# Patient Record
Sex: Male | Born: 1975 | Race: Black or African American | Hispanic: No | Marital: Married | Smoking: Never smoker
Health system: Southern US, Community
[De-identification: ages and names within clinical notes are randomized; demographics above are authoritative.]

## PROBLEM LIST (undated history)

## (undated) DIAGNOSIS — D696 Thrombocytopenia, unspecified: Secondary | ICD-10-CM

## (undated) DIAGNOSIS — D6851 Activated protein C resistance: Secondary | ICD-10-CM

## (undated) HISTORY — DX: Thrombocytopenia, unspecified: D69.6

## (undated) HISTORY — PX: NO PAST SURGERIES: SHX2092

## (undated) HISTORY — DX: Activated protein C resistance: D68.51

---

## 2003-02-26 ENCOUNTER — Encounter: Admission: RE | Admit: 2003-02-26 | Discharge: 2003-02-26 | Payer: Self-pay | Admitting: Family Medicine

## 2017-06-16 ENCOUNTER — Ambulatory Visit: Payer: BLUE CROSS/BLUE SHIELD | Admitting: Family Medicine

## 2017-06-16 ENCOUNTER — Encounter: Payer: Self-pay | Admitting: Family Medicine

## 2017-06-16 VITALS — BP 112/72 | HR 52 | Temp 98.1°F | Ht 69.0 in | Wt 174.0 lb

## 2017-06-16 DIAGNOSIS — D696 Thrombocytopenia, unspecified: Secondary | ICD-10-CM | POA: Insufficient documentation

## 2017-06-16 NOTE — Patient Instructions (Signed)
Let us know if you need anything.  

## 2017-06-16 NOTE — Progress Notes (Signed)
Pre visit review using our clinic review tool, if applicable. No additional management support is needed unless otherwise documented below in the visit note. 

## 2017-06-16 NOTE — Progress Notes (Signed)
Chief Complaint  Patient presents with  . Establish Care       New Patient Visit SUBJECTIVE: HPI: Curtis Lawson is an 42 y.o.male who is being seen for establishing care.  The patient was previously seen at [facility].   Pt has a hx of thrombocytopenia. Does not remember numbers, asymptomatic.  Curious about any screening that he needs. PSA questions. Had some chest spasms earlier in week, [facility] and had largely unremarkable EKG and Echo. No issues since then.   No Known Allergies  Past Medical History:  Diagnosis Date  . Thrombocytopenia (HCC)    History reviewed. No pertinent surgical history. Social History   Socioeconomic History  . Marital status: Married   Family History  Problem Relation Age of Onset  . Diabetes Mother    Takes no meds routinely.   ROS Cardiovascular: Denies chest pain  Respiratory: Denies dyspnea   OBJECTIVE: BP 112/72 (BP Location: Left Arm, Patient Position: Sitting, Cuff Size: Normal)   Pulse (!) 52   Temp 98.1 F (36.7 C) (Oral)   Ht 5\' 9"  (1.753 m)   Wt 174 lb (78.9 kg)   SpO2 98%   BMI 25.70 kg/m   Constitutional: -  VS reviewed -  Well developed, well nourished, appears stated age -  No apparent distress  Psychiatric: -  Oriented to person, place, and time -  Memory intact -  Affect and mood normal -  Fluent conversation, good eye contact -  Judgment and insight age appropriate  Eye: -  Conjunctivae clear, no discharge -  Pupils symmetric, round, reactive to light  ENMT: -  MMM    Pharynx moist, no exudate, no erythema  Neck: -  No gross swelling, no palpable masses -  Thyroid midline, not enlarged, mobile, no palpable masses  Cardiovascular: -  Reg rhythm, bradycardic -  No LE edema  Respiratory: -  Normal respiratory effort, no accessory muscle use, no retraction -  Breath sounds equal, no wheezes, no ronchi, no crackles  Skin: -  No significant lesion on inspection -  Warm and dry to  palpation   ASSESSMENT/PLAN: Thrombocytopenia (HCC)  CP has resolved.  Counseled on risks and benefits of prostate cancer screening, he opted to forego any screening for now. Will get us records.  Patient should return for CPE at earliest convenience. The patient voiced understanding and agreement to the plan.   Curtis Rocheicholas Paul Meiners OaksWendling, DO 06/16/17  11:59 AM

## 2017-11-29 ENCOUNTER — Encounter: Payer: Self-pay | Admitting: Family Medicine

## 2017-11-29 ENCOUNTER — Ambulatory Visit (INDEPENDENT_AMBULATORY_CARE_PROVIDER_SITE_OTHER): Payer: BLUE CROSS/BLUE SHIELD

## 2017-11-29 ENCOUNTER — Ambulatory Visit: Payer: BLUE CROSS/BLUE SHIELD | Admitting: Family Medicine

## 2017-11-29 VITALS — BP 128/76 | HR 76 | Ht 69.0 in | Wt 175.0 lb

## 2017-11-29 DIAGNOSIS — M25512 Pain in left shoulder: Secondary | ICD-10-CM

## 2017-11-29 MED ORDER — DICLOFENAC SODIUM 2 % TD SOLN
1.0000 | Freq: Two times a day (BID) | TRANSDERMAL | 3 refills | Status: DC
Start: 2017-11-29 — End: 2018-06-04

## 2017-11-29 MED ORDER — NAPROXEN-ESOMEPRAZOLE 500-20 MG PO TBEC: 1.0000 | DELAYED_RELEASE_TABLET | Freq: Two times a day (BID) | ORAL | 3 refills | Status: DC

## 2017-11-29 NOTE — Progress Notes (Signed)
Curtis Lawson - 42 y.o. male MRN [id number]  Date of birth: [birth year]  SUBJECTIVE:  Including CC & ROS.  Chief Complaint  Patient presents with  . Left Shoulder pain    Curtis Lawson is a 42 y.o. male that is presenting with left shoulder pain. Pain has been chronic in nature, increasing over the past tow months. He does crossfit three to four days week. Pain is located in the posterior aspect of his left shoulder. Mild to severe during abduction and raising his arm back. Denies tingling and numbness. He has not taken anything for the pain. Denies injury or trauma.     Review of Systems  Constitutional: Negative for fever.  HENT: Negative for congestion.   Respiratory: Negative for cough.   Cardiovascular: Negative for chest pain.  Gastrointestinal: Negative for abdominal pain.  Musculoskeletal: Negative for back pain.  Skin: Negative for color change.  Neurological: Negative for weakness.  Hematological: Negative for adenopathy.  Psychiatric/Behavioral: Negative for agitation.    HISTORY: Past Medical, Surgical, Social, and Family History Reviewed & Updated per EMR.   Pertinent Historical Findings include:  Past Medical History:  Diagnosis Date  . Thrombocytopenia (HCC)     No past surgical history on file.  No Known Allergies  Family History  Problem Relation Age of Onset  . Diabetes Mother      Social History   Socioeconomic History  . Marital status: Married    Spouse name: Not on file  . Number of children: Not on file  . Years of education: Not on file  . Highest education level: Not on file  Occupational History  . Not on file  Social Needs  . Financial resource strain: Not on file  . Food insecurity:    Worry: Not on file    Inability: Not on file  . Transportation needs:    Medical: Not on file    Non-medical: Not on file  Tobacco Use  . Smoking status: Never Smoker  . Smokeless tobacco: Never Used  Substance and Sexual Activity  .  Alcohol use: Yes    Comment: not a on regular basis--couple times per month  . Drug use: Never  . Sexual activity: Not on file  Lifestyle  . Physical activity:    Days per week: Not on file    Minutes per session: Not on file  . Stress: Not on file  Relationships  . Social connections:    Talks on phone: Not on file    Gets together: Not on file    Attends religious service: Not on file    Active member of club or organization: Not on file    Attends meetings of clubs or organizations: Not on file    Relationship status: Not on file  . Intimate partner violence:    Fear of current or ex partner: Not on file    Emotionally abused: Not on file    Physically abused: Not on file    Forced sexual activity: Not on file  Other Topics Concern  . Not on file  Social History Narrative  . Not on file     PHYSICAL EXAM:  VS: BP 128/76   Pulse 76   Ht 5\' 9"  (1.753 m)   Wt 175 lb (79.4 kg)   SpO2 98%   BMI 25.84 kg/m  Physical Exam Gen: NAD, alert, cooperative with exam, well-appearing ENT: normal lips, normal nasal mucosa,  Eye: normal EOM, normal conjunctiva and lids CV:  no  edema, +2 pedal pulses   Resp: no accessory muscle use, non-labored,   Skin: no rashes, no areas of induration  Neuro: normal tone, normal sensation to touch Psych:  normal insight, alert and oriented MSK:  Left Shoulder: Inspection reveals no abnormalities, atrophy or asymmetry. Palpation is normal with no tenderness over AC joint . ROM is full in all planes. Rotator cuff strength normal throughout. Normal Hawkin's tests, empty can sign. Speeds  tests normal. negative Obrien's Neurovascularly intact   Limited ultrasound: left shoulder :  Normal-appearing biceps tendon. Normal-appearing subscap.  There is a small calcification at the insertion. Normal-appearing supraspinatus.  Dynamic testing revealed impingement when abduction was 90 degrees or more.  There appears to be a bursa that is slightly  enlarged that impinges when this abduction occurs. Normal-appearing infraspinatus. Normal-appearing AC joint.  Summary: Appears to have a small bursa with impinging when dynamically testing greater than 90 degrees.  Ultrasound and interpretation by Clare Gandy, MD       ASSESSMENT & PLAN:   Left shoulder pain Evaluating on ultrasound suggest that in dynamic testing he has an impingement that is occurring.  Does not appear to have any structural changes to the rotator cuff -Try a course of Vimovo -Pennsaid provided. -Counseled on home exercise therapy. -If no improvement can consider physical therapy, imaging or injection therapy.

## 2017-11-29 NOTE — Patient Instructions (Signed)
Nice to meet you  Please take the medicine for 10 days straight and then as needed Please try the exercises  Please see me back in 3-4 weeks if your symptoms are not improving.

## 2017-11-29 NOTE — Assessment & Plan Note (Signed)
Evaluating on ultrasound suggest that in dynamic testing he has an impingement that is occurring.  Does not appear to have any structural changes to the rotator cuff -Try a course of Vimovo -Pennsaid provided. -Counseled on home exercise therapy. -If no improvement can consider physical therapy, imaging or injection therapy.

## 2018-02-09 ENCOUNTER — Encounter: Payer: Self-pay | Admitting: Family Medicine

## 2018-02-09 ENCOUNTER — Ambulatory Visit (INDEPENDENT_AMBULATORY_CARE_PROVIDER_SITE_OTHER): Payer: BLUE CROSS/BLUE SHIELD | Admitting: Family Medicine

## 2018-02-09 VITALS — BP 118/84 | HR 54 | Temp 98.2°F | Ht 69.0 in | Wt 177.0 lb

## 2018-02-09 DIAGNOSIS — L720 Epidermal cyst: Secondary | ICD-10-CM | POA: Insufficient documentation

## 2018-02-09 NOTE — Patient Instructions (Signed)
Epidermal Cyst An epidermal cyst is sometimes called an epidermal inclusion cyst or an infundibular cyst. It is a sac made of skin tissue. The sac contains a substance called keratin. Keratin is a protein that is normally secreted through the hair follicles. When keratin becomes trapped in the top layer of skin (epidermis), it can form an epidermal cyst. Epidermal cysts are usually found on the face, neck, trunk, and genitals. These cysts are usually harmless (benign), and they may not cause symptoms unless they become infected. It is important not to pop epidermal cysts yourself. What are the causes? This condition may be caused by:  A blocked hair follicle.  A hair that curls and re-enters the skin instead of growing straight out of the skin (ingrown hair).  A blocked pore.  Irritated skin.  An injury to the skin.  Certain conditions that are passed along from parent to child (inherited).  Human papillomavirus (HPV).  What increases the risk? The following factors may make you more likely to develop an epidermal cyst:  Having acne.  Being overweight.  Wearing tight clothing.  What are the signs or symptoms? The only symptom of this condition may be a small, painless lump underneath the skin. When an epidermal cyst becomes infected, symptoms may include:  Redness.  Inflammation.  Tenderness.  Warmth.  Fever.  Keratin draining from the cyst. Keratin may look like a grayish-white, bad-smelling substance.  Pus draining from the cyst.  How is this diagnosed? This condition is diagnosed with a physical exam. In some cases, you may have a sample of tissue (biopsy) taken from your cyst to be examined under a microscope or tested for bacteria. You may be referred to a health care provider who specializes in skin care (dermatologist). How is this treated? In many cases, epidermal cysts go away on their own without treatment. If a cyst becomes infected, treatment may  include:  Opening and draining the cyst. After draining, minor surgery to remove the rest of the cyst may be done.  Antibiotic medicine to help prevent infection.  Injections of medicines (steroids) that help to reduce inflammation.  Surgery to remove the cyst. Surgery may be done if: ? The cyst becomes large. ? The cyst bothers you. ? There is a chance that the cyst could turn into cancer.  Follow these instructions at home:  Take over-the-counter and prescription medicines only as told by your health care provider.  If you were prescribed an antibiotic, use it as told by your health care provider. Do not stop using the antibiotic even if you start to feel better.  Keep the area around your cyst clean and dry.  Wear loose, dry clothing.  Do not try to pop your cyst.  Avoid touching your cyst.  Check your cyst every day for signs of infection.  Keep all follow-up visits as told by your health care provider. This is important. How is this prevented?  Wear clean, dry, clothing.  Avoid wearing tight clothing.  Keep your skin clean and dry. Shower or take baths every day.  Wash your body with a benzoyl peroxide wash when you shower or bathe. Contact a health care provider if:  Your cyst develops symptoms of infection.  Your condition is not improving or is getting worse.  You develop a cyst that looks different from other cysts you have had.  You have a fever. Get help right away if:  Redness spreads from the cyst into the surrounding area. This information is   not intended to replace advice given to you by your health care provider. Make sure you discuss any questions you have with your health care provider. Document Released: 02/06/2004 Document Revised: 11/04/2015 Document Reviewed: 01/07/2015 Elsevier Interactive Patient Education  2018 Elsevier Inc.  

## 2018-02-09 NOTE — Progress Notes (Signed)
Pre visit review using our clinic review tool, if applicable. No additional management support is needed unless otherwise documented below in the visit note. 

## 2018-02-09 NOTE — Progress Notes (Signed)
Chief Complaint  Patient presents with  . Mass    left side of neck    Curtis Lawson is a 42 y.o. male here for a skin complaint.  Duration: 2 months Location: L side of neck Pruritic? No Painful? No Drainage? No New soaps/lotions/topicals/detergents? No Sick contacts? No Other associated symptoms: None Therapies tried thus far: none  ROS:  Const: No fevers Skin: As noted in HPI  Past Medical History:  Diagnosis Date  . Thrombocytopenia (HCC)     BP 118/84 (BP Location: Right Arm, Patient Position: Sitting, Cuff Size: Normal)   Pulse (!) 54   Temp 98.2 F (36.8 C) (Oral)   Ht 5\' 9"  (1.753 m)   Wt 177 lb (80.3 kg)   SpO2 98%   BMI 26.14 kg/m  Gen: awake, alert, appearing stated age Lungs: No accessory muscle use Skin: L side of neck, spherical mass with black pinpoint area just off center. Measures approx 0.3 cm in diameter. No drainage, erythema, TTP, fluctuance, excoriation Psych: Age appropriate judgment and insight  Epidermal cyst  Info given. Will remove if his wife wants him to.  F/u prn. The patient voiced understanding and agreement to the plan.  Jilda Rocheicholas Paul [place], DO 02/09/18 11:21 AM

## 2018-03-20 ENCOUNTER — Emergency Department (HOSPITAL_BASED_OUTPATIENT_CLINIC_OR_DEPARTMENT_OTHER)
Admission: EM | Admit: 2018-03-20 | Discharge: 2018-03-20 | Disposition: A | Discharge status: Home / Self Care | Payer: BLUE CROSS/BLUE SHIELD | Attending: Emergency Medicine | Admitting: Emergency Medicine

## 2018-03-20 ENCOUNTER — Encounter (HOSPITAL_BASED_OUTPATIENT_CLINIC_OR_DEPARTMENT_OTHER): Payer: Self-pay | Admitting: Emergency Medicine

## 2018-03-20 ENCOUNTER — Other Ambulatory Visit: Payer: Self-pay

## 2018-03-20 DIAGNOSIS — Z79899 Other long term (current) drug therapy: Secondary | ICD-10-CM | POA: Insufficient documentation

## 2018-03-20 DIAGNOSIS — R1013 Epigastric pain: Secondary | ICD-10-CM | POA: Diagnosis present

## 2018-03-20 LAB — COMPREHENSIVE METABOLIC PANEL
ALT: 26 U/L (ref 0–44)
AST: 22 U/L (ref 15–41)
Albumin: 4 g/dL (ref 3.5–5.0)
Alkaline Phosphatase: 48 U/L (ref 38–126)
Anion gap: 7 (ref 5–15)
BUN: 12 mg/dL (ref 6–20)
CO2: 22 mmol/L (ref 22–32)
Calcium: 8.8 mg/dL — ABNORMAL LOW (ref 8.9–10.3)
Chloride: 104 mmol/L (ref 98–111)
Creatinine, Ser: 1.1 mg/dL (ref 0.61–1.24)
GFR calc Af Amer: >60 mL/min (ref >60)
GFR calc non Af Amer: >60 mL/min (ref >60)
Glucose, Bld: 97 mg/dL (ref 70–99)
Potassium: 3.4 mmol/L — ABNORMAL LOW (ref 3.5–5.1)
Sodium: 133 mmol/L — ABNORMAL LOW (ref 135–145)
Total Bilirubin: 1.1 mg/dL (ref 0.3–1.2)
Total Protein: 7.5 g/dL (ref 6.5–8.1)

## 2018-03-20 LAB — CBC
HCT: 47.3 % (ref 39.0–52.0)
Hemoglobin: 14.8 g/dL (ref 13.0–17.0)
MCH: 26.4 pg (ref 26.0–34.0)
MCHC: 31.3 g/dL (ref 30.0–36.0)
MCV: 84.5 fL (ref 80.0–100.0)
PLATELETS: 141 10*3/uL — AB (ref 150–400)
RBC: 5.6 MIL/uL (ref 4.22–5.81)
RDW: 13.2 % (ref 11.5–15.5)
WBC: 4.2 10*3/uL (ref 4.0–10.5)
nRBC: 0 % (ref 0.0–0.2)

## 2018-03-20 LAB — URINALYSIS, ROUTINE W REFLEX MICROSCOPIC
Bilirubin Urine: NEGATIVE
Glucose, UA: NEGATIVE mg/dL
Hgb urine dipstick: NEGATIVE
Ketones, ur: 15 mg/dL — AB
Leukocytes, UA: NEGATIVE
Nitrite: NEGATIVE
Protein, ur: NEGATIVE mg/dL
Specific Gravity, Urine: 1.025 (ref 1.005–1.030)
pH: 6 (ref 5.0–8.0)

## 2018-03-20 LAB — LIPASE, BLOOD: Lipase: 27 U/L (ref 11–51)

## 2018-03-20 LAB — TROPONIN I: Troponin I: <0.03 ng/mL (ref <0.03)

## 2018-03-20 MED ORDER — ONDANSETRON 8 MG PO TBDP: 8.0000 mg | ORAL_TABLET | Freq: Three times a day (TID) | ORAL | 0 refills | Status: DC | PRN

## 2018-03-20 NOTE — ED Provider Notes (Signed)
[facility] EMERGENCY DEPARTMENT Provider Note   CSN: [id number] Arrival date & time: 03/20/18  0705     History   Chief Complaint Chief Complaint  Patient presents with  . Abdominal Pain    HPI Geroge Basemanlexander Lawson is a 42 y.o. male.  HPI Patient presents to the emergency room for evaluation of abdominal pain.  Patient states he started having some upper abdominal discomfort 2 days ago.  He was having trouble with feeling warm and then chilled.  He thought he probably had a fever but did not measure his temperature.  He had some mild discomfort in his upper abdomen associated with nausea but did not have any vomiting.  The symptoms have been improving and that he no longer has any nausea but he does still has some mild discomfort in his upper abdomen.  Patient's last bowel movement was about 24 hours ago.  He denies any dysuria.  He has not had any fevers or chills.  No chest pain or shortness of breath.  Patient states he is generally very healthy and has never had something like this so it concerned him and he want to make sure he was not having issues with his appendix or gallbladder. Past Medical History:  Diagnosis Date  . Thrombocytopenia [facility](HCC)     Patient Active Problem List   Diagnosis Date Noted  . Epidermal cyst 02/09/2018  . Left shoulder pain 11/29/2017  . Thrombocytopenia (HCC) 06/16/2017    History reviewed. No pertinent surgical history.      Home Medications    Prior to Admission medications   Medication Sig Start Date End Date Taking? Authorizing Provider  Diclofenac Sodium (PENNSAID) 2 % SOLN Place 1 application onto the skin 2 (two) times daily. 11/29/17   Myra RudeSchmitz, Jeremy E, MD  Naproxen-Esomeprazole 500-20 MG TBEC Take 1 tablet by mouth 2 (two) times daily. 11/29/17   Myra RudeSchmitz, Jeremy E, MD  ondansetron (ZOFRAN ODT) 8 MG disintegrating tablet Take 1 tablet (8 mg total) by mouth every 8 (eight) hours as needed for nausea or vomiting. 03/20/18    Linwood DibblesKnapp, Chinita Schimpf, MD    Family History Family History  Problem Relation Age of Onset  . Diabetes Mother     Social History Social History   Tobacco Use  . Smoking status: Never Smoker  . Smokeless tobacco: Never Used  Substance Use Topics  . Alcohol use: Yes    Comment: not a on regular basis--couple times per month  . Drug use: Never     Allergies   Patient has no known allergies.   Review of Systems Review of Systems  All other systems reviewed and are negative.    Physical Exam Updated Vital Signs BP 112/69   Pulse (!) 52   Temp 98.8 F (37.1 C) (Oral)   Resp 13   Ht 1.727 m (5\' 8" )   Wt 78.9 kg   SpO2 99%   BMI 26.46 kg/m   Physical Exam Vitals signs and nursing note reviewed.  Constitutional:      General: He is not in acute distress.    Appearance: He is well-developed.  HENT:     Head: Normocephalic and atraumatic.     Right Ear: External ear normal.     Left Ear: External ear normal.  Eyes:     General: No scleral icterus.       Right eye: No discharge.        Left eye: No discharge.     Conjunctiva/sclera:  Conjunctivae normal.  Neck:     Musculoskeletal: Neck supple.     Trachea: No tracheal deviation.  Cardiovascular:     Rate and Rhythm: Normal rate and regular rhythm.  Pulmonary:     Effort: Pulmonary effort is normal. No respiratory distress.     Breath sounds: Normal breath sounds. No stridor. No wheezing or rales.  Abdominal:     General: Bowel sounds are normal. There is no distension.     Palpations: Abdomen is soft.     Tenderness: There is no abdominal tenderness. There is no guarding or rebound.  Musculoskeletal:        General: No tenderness.  Skin:    General: Skin is warm and dry.     Findings: No rash.  Neurological:     Mental Status: He is alert.     Cranial Nerves: No cranial nerve deficit (no facial droop, extraocular movements intact, no slurred speech).     Sensory: No sensory deficit.     Motor: No abnormal  muscle tone or seizure activity.     Coordination: Coordination normal.      ED Treatments / Results  Labs (all labs ordered are listed, but only abnormal results are displayed) Labs Reviewed  CBC - Abnormal; Notable for the following components:      Result Value   Platelets 141 (*)    All other components within normal limits  COMPREHENSIVE METABOLIC PANEL - Abnormal; Notable for the following components:   Sodium 133 (*)    Potassium 3.4 (*)    Calcium 8.8 (*)    All other components within normal limits  URINALYSIS, ROUTINE W REFLEX MICROSCOPIC - Abnormal; Notable for the following components:   Ketones, ur 15 (*)    All other components within normal limits  TROPONIN I  LIPASE, BLOOD    EKG EKG Interpretation  Date/Time:  Tuesday March 20 2018 07:25:59 EST Ventricular Rate:  58 PR Interval:    QRS Duration: 91 QT Interval:  421 QTC Calculation: 414 R Axis:   46 Text Interpretation:  Sinus rhythm Abnormal R-wave progression, early transition Probable LVH with secondary repol abnrm Abnormal T, probable ischemia, anterior leads No old tracing to compare Confirmed by Linwood Dibbles [phone]) on 03/20/2018 7:29:59 AM Also confirmed by Linwood Dibbles [phone]), editor Barbette Hair [phone])  on 03/20/2018 7:59:41 AM   Radiology No results found.  Procedures Procedures (including critical care time)  Medications Ordered in ED Medications - No data to display   Initial Impression / Assessment and Plan / ED Course  I have reviewed the triage vital signs and the nursing notes.  Pertinent labs & imaging results that were available during my care of the patient were reviewed by me and considered in my medical decision making (see chart for details).  Clinical Course as of Mar 20 936  Tue Mar 20, 2018  2956 Initial EKG ordered per protocol.  Sx not concerning for ACS.   Likely lvh.  Will check trop   [JK]    Clinical Course User Index [JK] Linwood Dibbles, MD    Patient  presented to the emergency room for evaluation of abdominal pain.  Patient's exam was reassuring.  He had no significant tenderness.  Patient appeared comfortable and has not had any issues with vomiting or fever.  Laboratory tests are unremarkable and reassuring.  Symptoms could be related to gastritis.  I recommended taking Pepcid as needed.  He can also take Zofran for nausea.  Follow-up  with primary care doctor.  Warning signs and precautions discussed  Patient's EKG is abnormal appearing however I do not think this is related to his illness.  There are no signs to suggest acute cardiac ischemia.  I did discuss the EKG finding with the patient and he mentioned that he did have a work-up in the past including an echocardiogram and further cardiac evaluation because of his EKG.  Final Clinical Impressions(s) / ED Diagnoses   Final diagnoses:  Epigastric pain    ED Discharge Orders         Ordered    ondansetron (ZOFRAN ODT) 8 MG disintegrating tablet  Every 8 hours PRN     03/20/18 0936           Linwood DibblesKnapp, Silvio Sausedo, MD 03/20/18 [phone]

## 2018-03-20 NOTE — ED Notes (Signed)
Pt unable to void at this time. 

## 2018-03-20 NOTE — Discharge Instructions (Addendum)
Try taking over-the-counter Pepcid as needed.  You can also supplement with Tylenol.  Follow-up with your primary care doctor if the symptoms do not resolve in the next week.  Return to the emergency room for fevers or vomiting

## 2018-03-20 NOTE — ED Triage Notes (Signed)
Upper mid abd pain with nausea x 2 days.

## 2018-05-14 ENCOUNTER — Encounter: Payer: Self-pay | Admitting: Family Medicine

## 2018-05-14 ENCOUNTER — Ambulatory Visit: Payer: BLUE CROSS/BLUE SHIELD | Admitting: Family Medicine

## 2018-05-14 VITALS — BP 128/80 | HR 65 | Temp 98.3°F | Ht 69.0 in | Wt 180.0 lb

## 2018-05-14 DIAGNOSIS — L989 Disorder of the skin and subcutaneous tissue, unspecified: Secondary | ICD-10-CM | POA: Diagnosis not present

## 2018-05-14 DIAGNOSIS — L309 Dermatitis, unspecified: Secondary | ICD-10-CM | POA: Diagnosis not present

## 2018-05-14 NOTE — Progress Notes (Signed)
Chief Complaint  Patient presents with  . Rash    Curtis Lawson is a 43 y.o. male here for a skin complaint.  Duration: several weeks Location: UE's and RLE Pruritic? Yes Painful? No Drainage? No New soaps/lotions/topicals/detergents? No Sick contacts? No Other associated symptoms: some scaling Therapies tried thus far: Betamethasone sporadically  ROS:  Const: No fevers Skin: As noted in HPI  Past Medical History:  Diagnosis Date  . Thrombocytopenia (HCC)     BP 128/80 (BP Location: Left Arm, Patient Position: Sitting, Cuff Size: Normal)   Pulse 65   Temp 98.3 F (36.8 C) (Oral)   Ht 5\' 9"  (1.753 m)   Wt 180 lb (81.6 kg)   SpO2 98%   BMI 26.58 kg/m  Gen: awake, alert, appearing stated age Lungs: No accessory muscle use Skin: flesh colored papules on forearms, there are circular hyperpigmented areas with hyperpigmentation on R anterior thigh and right medial upper extremity. No drainage, erythema, TTP, fluctuance, excoriation Psych: Age appropriate judgment and insight  Dermatitis  Skin lesion  Continue steroid for 4 weeks, be diligent with use.  Follow-up with dermatology if no improvement.  The other areas look like scar tissue. F/u prn. The patient voiced understanding and agreement to the plan.  Jilda Roche [place], DO 05/14/18 2:29 PM

## 2018-05-14 NOTE — Patient Instructions (Signed)
This does not look infectious.  Use the cream twice daily over the scaly areas.  The other areas look like scar tissue. If they don't improve, make an appointment with your skin doctor.   Let us know if you need anything.

## 2018-05-31 ENCOUNTER — Encounter: Payer: BLUE CROSS/BLUE SHIELD | Admitting: Family Medicine

## 2018-06-04 ENCOUNTER — Ambulatory Visit (INDEPENDENT_AMBULATORY_CARE_PROVIDER_SITE_OTHER): Payer: BLUE CROSS/BLUE SHIELD | Admitting: Family Medicine

## 2018-06-04 ENCOUNTER — Other Ambulatory Visit: Payer: Self-pay

## 2018-06-04 ENCOUNTER — Encounter: Payer: Self-pay | Admitting: Family Medicine

## 2018-06-04 VITALS — BP 120/62 | HR 64 | Temp 98.8°F | Ht 69.0 in | Wt 179.5 lb

## 2018-06-04 DIAGNOSIS — D6851 Activated protein C resistance: Secondary | ICD-10-CM | POA: Diagnosis not present

## 2018-06-04 DIAGNOSIS — Z23 Encounter for immunization: Secondary | ICD-10-CM

## 2018-06-04 DIAGNOSIS — L309 Dermatitis, unspecified: Secondary | ICD-10-CM

## 2018-06-04 DIAGNOSIS — Z0000 Encounter for general adult medical examination without abnormal findings: Secondary | ICD-10-CM | POA: Diagnosis not present

## 2018-06-04 LAB — LIPID PANEL
Cholesterol: 214 mg/dL — ABNORMAL HIGH (ref 0–200)
HDL: 49.8 mg/dL (ref >39.00)
LDL Cholesterol: 133 mg/dL — ABNORMAL HIGH (ref 0–99)
NonHDL: 164.2
Total CHOL/HDL Ratio: 4
Triglycerides: 154 mg/dL — ABNORMAL HIGH (ref 0.0–149.0)
VLDL: 30.8 mg/dL (ref 0.0–40.0)

## 2018-06-04 LAB — CBC
HCT: 43 % (ref 39.0–52.0)
Hemoglobin: 14.2 g/dL (ref 13.0–17.0)
MCHC: 32.9 g/dL (ref 30.0–36.0)
MCV: 83.2 fl (ref 78.0–100.0)
Platelets: 164 10*3/uL (ref 150.0–400.0)
RBC: 5.17 Mil/uL (ref 4.22–5.81)
RDW: 13.9 % (ref 11.5–15.5)
WBC: 5.1 10*3/uL (ref 4.0–10.5)

## 2018-06-04 LAB — COMPREHENSIVE METABOLIC PANEL
ALT: 20 U/L (ref 0–53)
AST: 24 U/L (ref 0–37)
Albumin: 4.5 g/dL (ref 3.5–5.2)
Alkaline Phosphatase: 52 U/L (ref 39–117)
BUN: 13 mg/dL (ref 6–23)
CO2: 31 mEq/L (ref 19–32)
Calcium: 9.6 mg/dL (ref 8.4–10.5)
Chloride: 103 mEq/L (ref 96–112)
Creatinine, Ser: 1.17 mg/dL (ref 0.40–1.50)
GFR: 82.5 mL/min (ref >60.00)
Glucose, Bld: 95 mg/dL (ref 70–99)
Potassium: 4.1 mEq/L (ref 3.5–5.1)
Sodium: 141 mEq/L (ref 135–145)
Total Bilirubin: 0.7 mg/dL (ref 0.2–1.2)
Total Protein: 7.2 g/dL (ref 6.0–8.3)

## 2018-06-04 MED ORDER — TRIAMCINOLONE ACETONIDE 0.5 % EX OINT: 1.0000 "application " | TOPICAL_OINTMENT | Freq: Two times a day (BID) | CUTANEOUS | 0 refills | Status: DC

## 2018-06-04 NOTE — Progress Notes (Signed)
Chief Complaint  Patient presents with  . Annual Exam    Well Male Curtis Lawson is here for a complete physical.   His last physical was >1 year ago.  Current diet: in general, a "healthy" diet.   Current exercise: Crossfit Weight trend: stable Daytime fatigue? No. Seat belt? Yes.    Health maintenance Tetanus- No HIV- Yes  Past Medical History:  Diagnosis Date  . 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 Thrombocytopenia (HCC)     Past Surgical History:  Procedure Laterality Date  . NO PAST SURGERIES      Medications  Takes no meds routinely.   Allergies No Known Allergies  Family History Family History  Problem Relation Age of Onset  . Diabetes Mother     Review of Systems: Constitutional: no fevers or chills Eye:  no recent significant change in vision Ear/Nose/Mouth/Throat:  Ears:  no tinnitus or hearing loss Nose/Mouth/Throat:  no complaints of nasal congestion, no sore throat Cardiovascular:  no chest pain, no palpitations Respiratory:  no cough and no shortness of breath Gastrointestinal:  no abdominal pain, no change in bowel habits GU:  Male: negative for dysuria, frequency, and incontinence and negative for prostate symptoms Musculoskeletal/Extremities:  no pain, redness, or swelling of the joints Integumentary (Skin/Breast): +skin lesions on UE's and RLE; otherwise no abnormal skin lesions reported Neurologic:  no headaches, no numbness, tingling Endocrine: No unexpected weight changes Hematologic/Lymphatic:  no night sweats  Exam BP 120/62 (BP Location: Left Arm, Patient Position: Sitting, Cuff Size: Normal)   Pulse 64   Temp 98.8 F (37.1 C) (Oral)   Ht 5\' 9"  (1.753 m)   Wt 179 lb 8 oz (81.4 kg)   SpO2 98%   BMI 26.51 kg/m  General:  well developed, well nourished, in no apparent distress Skin: scarring noted on forearms and a hyperpigmented patch on the distal anterior RLE; otherwise no significant moles, warts, or growths Head:  no masses,  lesions, or tenderness Eyes:  pupils equal and round, sclera anicteric without injection Ears:  canals without lesions, TMs shiny without retraction, no obvious effusion, no erythema Nose:  nares patent, septum midline, mucosa normal Throat/Pharynx:  lips and gingiva without lesion; tongue and uvula midline; non-inflamed pharynx; no exudates or postnasal drainage Neck: neck supple without adenopathy, thyromegaly, or masses Lungs:  clear to auscultation, breath sounds equal bilaterally, no respiratory distress Cardio:  regular rate and rhythm, no bruits, no LE edema Abdomen:  abdomen soft, nontender; bowel sounds normal; no masses or organomegaly Rectal: Deferred Musculoskeletal:  symmetrical muscle groups noted without atrophy or deformity Extremities:  no clubbing, cyanosis, or edema, no deformities, no skin discoloration Neuro:  gait normal; deep tendon reflexes normal and symmetric Psych: well oriented with normal range of affect and appropriate judgment/insight  Assessment and Plan  Well adult exam - Plan: Comprehensive metabolic panel, Lipid panel, CBC  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 male. Counseled on diet and exercise. Counseled on risks and benefits of prostate cancer screening w psa, he opted for forego testing at this time. Trial a diff CS cream. If no improvement, will have him f/u w derm or have a biopsy.  Other orders as above. Follow up in 1 year pending the above workup. The patient voiced understanding and agreement to the plan.  Jilda Roche  Dejanae Helser, DO 06/04/18 3:27 PM

## 2018-06-04 NOTE — Patient Instructions (Signed)
Give us 2-3 business days to get the results of your labs back.   Keep the diet clean and stay active.  Let us know if you need anything. 

## 2019-06-05 ENCOUNTER — Encounter: Payer: BLUE CROSS/BLUE SHIELD | Admitting: Family Medicine

## 2019-06-05 DIAGNOSIS — Z0289 Encounter for other administrative examinations: Secondary | ICD-10-CM

## 2019-07-03 ENCOUNTER — Ambulatory Visit (INDEPENDENT_AMBULATORY_CARE_PROVIDER_SITE_OTHER): Payer: BC Managed Care – PPO | Admitting: Family Medicine

## 2019-07-03 ENCOUNTER — Other Ambulatory Visit: Payer: Self-pay | Admitting: Family Medicine

## 2019-07-03 ENCOUNTER — Other Ambulatory Visit: Payer: Self-pay

## 2019-07-03 ENCOUNTER — Encounter: Payer: Self-pay | Admitting: Family Medicine

## 2019-07-03 VITALS — BP 112/78 | HR 71 | Temp 96.8°F | Ht 69.0 in | Wt 181.0 lb

## 2019-07-03 DIAGNOSIS — Z125 Encounter for screening for malignant neoplasm of prostate: Secondary | ICD-10-CM | POA: Diagnosis not present

## 2019-07-03 DIAGNOSIS — E785 Hyperlipidemia, unspecified: Secondary | ICD-10-CM

## 2019-07-03 DIAGNOSIS — Z0000 Encounter for general adult medical examination without abnormal findings: Secondary | ICD-10-CM | POA: Diagnosis not present

## 2019-07-03 LAB — COMPREHENSIVE METABOLIC PANEL
ALT: 16 U/L (ref 0–53)
AST: 16 U/L (ref 0–37)
Albumin: 4.5 g/dL (ref 3.5–5.2)
Alkaline Phosphatase: 52 U/L (ref 39–117)
BUN: 13 mg/dL (ref 6–23)
CO2: 29 mEq/L (ref 19–32)
Calcium: 9.7 mg/dL (ref 8.4–10.5)
Chloride: 101 mEq/L (ref 96–112)
Creatinine, Ser: 1.24 mg/dL (ref 0.40–1.50)
GFR: 76.76 mL/min (ref >60.00)
Glucose, Bld: 89 mg/dL (ref 70–99)
Potassium: 4 mEq/L (ref 3.5–5.1)
Sodium: 138 mEq/L (ref 135–145)
Total Bilirubin: 0.8 mg/dL (ref 0.2–1.2)
Total Protein: 7.2 g/dL (ref 6.0–8.3)

## 2019-07-03 LAB — LIPID PANEL
Cholesterol: 202 mg/dL — ABNORMAL HIGH (ref 0–200)
HDL: 47.5 mg/dL (ref >39.00)
LDL Cholesterol: 145 mg/dL — ABNORMAL HIGH (ref 0–99)
NonHDL: 154.77
Total CHOL/HDL Ratio: 4
Triglycerides: 50 mg/dL (ref 0.0–149.0)
VLDL: 10 mg/dL (ref 0.0–40.0)

## 2019-07-03 LAB — CBC
HCT: 42.1 % (ref 39.0–52.0)
Hemoglobin: 13.6 g/dL (ref 13.0–17.0)
MCHC: 32.4 g/dL (ref 30.0–36.0)
MCV: 83.2 fl (ref 78.0–100.0)
Platelets: 154 10*3/uL (ref 150.0–400.0)
RBC: 5.06 Mil/uL (ref 4.22–5.81)
RDW: 13.9 % (ref 11.5–15.5)
WBC: 4.5 10*3/uL (ref 4.0–10.5)

## 2019-07-03 LAB — PSA: PSA: 0.33 ng/mL (ref 0.10–4.00)

## 2019-07-03 NOTE — Progress Notes (Signed)
Chief Complaint  Patient presents with  . Annual Exam    Well Male Curtis Lawson is here for a complete physical.   His last physical was >1 year ago.  Current diet: in general, a "pretty good" diet.   Current exercise: starting back at gym; wt resistance Weight trend: stable Daytime fatigue? No. Seat belt? Yes.     Health maintenance Tetanus- Yes HIV- Yes  Past Medical History:  Diagnosis Date  . 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 Thrombocytopenia (HCC)      Past Surgical History:  Procedure Laterality Date  . NO PAST SURGERIES      Medications  Takes no meds routinely.  Allergies No Known Allergies  Family History Family History  Problem Relation Age of Onset  . Diabetes Mother     Review of Systems: Constitutional: no fevers or chills Eye:  no recent significant change in vision Ear/Nose/Mouth/Throat:  Ears:  no hearing loss Nose/Mouth/Throat:  no complaints of nasal congestion, no sore throat Cardiovascular:  no chest pain Respiratory:  no shortness of breath Gastrointestinal:  no abdominal pain, no change in bowel habits GU:  Male: negative for dysuria, frequency, and incontinence Musculoskeletal/Extremities:  no pain of the joints Integumentary (Skin/Breast):  no abnormal skin lesions reported Neurologic:  no headaches Endocrine: No unexpected weight changes Hematologic/Lymphatic:  no night sweats  Exam BP 112/78 (BP Location: Left Arm, Patient Position: Sitting, Cuff Size: Normal)   Pulse 71   Temp (!) 96.8 F (36 C) (Temporal)   Ht 5\' 9"  (1.753 m)   Wt 181 lb (82.1 kg)   SpO2 96%   BMI 26.73 kg/m  General:  well developed, well nourished, in no apparent distress Skin:  no significant moles, warts, or growths Head:  no masses, lesions, or tenderness Eyes:  pupils equal and round, sclera anicteric without injection Ears:  canals without lesions, TMs shiny without retraction, no obvious effusion, no erythema Nose:  nares patent, septum  midline, mucosa normal Throat/Pharynx:  lips and gingiva without lesion; tongue and uvula midline; non-inflamed pharynx; no exudates or postnasal drainage Neck: neck supple without adenopathy, thyromegaly, or masses Lungs:  clear to auscultation, breath sounds equal bilaterally, no respiratory distress Cardio:  regular rate and rhythm, no bruits, no LE edema Abdomen:  abdomen soft, nontender; bowel sounds normal; no masses or organomegaly Rectal: Deferred Musculoskeletal:  symmetrical muscle groups noted without atrophy or deformity Extremities:  no clubbing, cyanosis, or edema, no deformities, no skin discoloration Neuro:  gait normal; deep tendon reflexes normal and symmetric Psych: well oriented with normal range of affect and appropriate judgment/insight  Assessment and Plan  Well adult exam - Plan: CBC, Comprehensive metabolic panel, Lipid panel  Screening for malignant neoplasm of prostate - Plan: PSA   Well 44 y.o. male. Counseled on diet and exercise. Counseled on risks and benefits of prostate cancer screening with PSA. The patient agrees to undergo screening.  Other orders as above. Follow up in 1 year pending the above workup. The patient voiced understanding and agreement to the plan.  [address] [place], DO 07/03/19 8:23 AM

## 2019-07-03 NOTE — Patient Instructions (Addendum)
Give us 2-3 business days to get the results of your labs back.   Keep the diet clean and stay active.  Claritin (loratadine), Allegra (fexofenadine), Zyrtec (cetirizine) which is also equivalent to Xyzal (levocetirizine); these are listed in order from weakest to strongest. Generic, and therefore cheaper, options are in the parentheses.   Flonase (fluticasone); nasal spray that is over the counter. 2 sprays each nostril, once daily. Aim towards the same side eye when you spray.  There are available OTC, and the generic versions, which may be cheaper, are in parentheses. Show this to a pharmacist if you have trouble finding any of these items.  Let us know if you need anything. 

## 2019-09-02 ENCOUNTER — Other Ambulatory Visit (INDEPENDENT_AMBULATORY_CARE_PROVIDER_SITE_OTHER): Payer: BC Managed Care – PPO

## 2019-09-02 ENCOUNTER — Other Ambulatory Visit: Payer: Self-pay | Admitting: Family Medicine

## 2019-09-02 ENCOUNTER — Other Ambulatory Visit: Payer: Self-pay

## 2019-09-02 DIAGNOSIS — E785 Hyperlipidemia, unspecified: Secondary | ICD-10-CM

## 2019-09-02 LAB — LIPID PANEL
Cholesterol: 193 mg/dL (ref 0–200)
HDL: 52.6 mg/dL (ref >39.00)
LDL Cholesterol: 129 mg/dL — ABNORMAL HIGH (ref 0–99)
NonHDL: 140.73
Total CHOL/HDL Ratio: 4
Triglycerides: 60 mg/dL (ref 0.0–149.0)
VLDL: 12 mg/dL (ref 0.0–40.0)

## 2019-09-02 NOTE — Progress Notes (Signed)
lipi

## 2019-12-03 ENCOUNTER — Other Ambulatory Visit (INDEPENDENT_AMBULATORY_CARE_PROVIDER_SITE_OTHER): Payer: BC Managed Care – PPO

## 2019-12-03 ENCOUNTER — Telehealth: Payer: Self-pay | Admitting: Family Medicine

## 2019-12-03 ENCOUNTER — Other Ambulatory Visit: Payer: Self-pay

## 2019-12-03 DIAGNOSIS — E785 Hyperlipidemia, unspecified: Secondary | ICD-10-CM

## 2019-12-03 LAB — LIPID PANEL
Cholesterol: 196 mg/dL (ref <200)
HDL: 53 mg/dL (ref >=40)
LDL Cholesterol (Calc): 129 mg/dL (calc) — ABNORMAL HIGH
Non-HDL Cholesterol (Calc): 143 mg/dL (calc) — ABNORMAL HIGH (ref <130)
Total CHOL/HDL Ratio: 3.7 (calc) (ref <5.0)
Triglycerides: 58 mg/dL (ref <150)

## 2019-12-03 NOTE — Addendum Note (Signed)
Addended by: Mervin Kung A on: 12/03/2019 07:54 AM   Modules accepted: Orders

## 2019-12-03 NOTE — Telephone Encounter (Signed)
Pt came in office with concern about No show charge, pt stated got a bill for no show appt on 06-05-2019 when pt stated he had made an appt for his cpe for 07-03-2019, pt was not aware of the 06-05-2019 appt and that is why he did not come to this appt and that his cpe was for April. Pt mentioned that his bill was sent to collection but he has been working with this no show charge since then.  Pt would like to be called regarding situation of bill at [phone].

## 2019-12-05 NOTE — Telephone Encounter (Signed)
Left message on patient's voice mail that I have sent an email to [employer] for them to look into the removal of the bad debt balance from the no show fee generated from [facility] 06/05/19.

## 2020-07-10 ENCOUNTER — Encounter: Payer: BC Managed Care – PPO | Admitting: Family Medicine

## 2020-07-17 ENCOUNTER — Encounter: Payer: BC Managed Care – PPO | Admitting: Family Medicine

## 2020-07-31 ENCOUNTER — Encounter: Payer: 59 | Admitting: Family Medicine

## 2020-08-14 ENCOUNTER — Encounter: Payer: Self-pay | Admitting: Family Medicine

## 2020-08-14 ENCOUNTER — Other Ambulatory Visit: Payer: Self-pay

## 2020-08-14 ENCOUNTER — Ambulatory Visit (INDEPENDENT_AMBULATORY_CARE_PROVIDER_SITE_OTHER): Payer: 59 | Admitting: Family Medicine

## 2020-08-14 VITALS — BP 132/82 | HR 64 | Temp 98.6°F | Ht 69.0 in | Wt 184.1 lb

## 2020-08-14 DIAGNOSIS — Z1159 Encounter for screening for other viral diseases: Secondary | ICD-10-CM | POA: Diagnosis not present

## 2020-08-14 DIAGNOSIS — Z0000 Encounter for general adult medical examination without abnormal findings: Secondary | ICD-10-CM | POA: Diagnosis not present

## 2020-08-14 DIAGNOSIS — Z125 Encounter for screening for malignant neoplasm of prostate: Secondary | ICD-10-CM

## 2020-08-14 DIAGNOSIS — D6851 Activated protein C resistance: Secondary | ICD-10-CM | POA: Diagnosis not present

## 2020-08-14 LAB — COMPREHENSIVE METABOLIC PANEL
ALT: 23 U/L (ref 0–53)
AST: 18 U/L (ref 0–37)
Albumin: 4.6 g/dL (ref 3.5–5.2)
Alkaline Phosphatase: 60 U/L (ref 39–117)
BUN: 14 mg/dL (ref 6–23)
CO2: 30 mEq/L (ref 19–32)
Calcium: 9.9 mg/dL (ref 8.4–10.5)
Chloride: 103 mEq/L (ref 96–112)
Creatinine, Ser: 1.17 mg/dL (ref 0.40–1.50)
GFR: 75.69 mL/min (ref >60.00)
Glucose, Bld: 91 mg/dL (ref 70–99)
Potassium: 4 mEq/L (ref 3.5–5.1)
Sodium: 140 mEq/L (ref 135–145)
Total Bilirubin: 0.7 mg/dL (ref 0.2–1.2)
Total Protein: 7.3 g/dL (ref 6.0–8.3)

## 2020-08-14 LAB — LIPID PANEL
Cholesterol: 222 mg/dL — ABNORMAL HIGH (ref 0–200)
HDL: 50.5 mg/dL (ref >39.00)
LDL Cholesterol: 162 mg/dL — ABNORMAL HIGH (ref 0–99)
NonHDL: 171.61
Total CHOL/HDL Ratio: 4
Triglycerides: 50 mg/dL (ref 0.0–149.0)
VLDL: 10 mg/dL (ref 0.0–40.0)

## 2020-08-14 LAB — CBC
HCT: 44.5 % (ref 39.0–52.0)
Hemoglobin: 14.6 g/dL (ref 13.0–17.0)
MCHC: 32.8 g/dL (ref 30.0–36.0)
MCV: 82.5 fl (ref 78.0–100.0)
Platelets: 144 10*3/uL — ABNORMAL LOW (ref 150.0–400.0)
RBC: 5.4 Mil/uL (ref 4.22–5.81)
RDW: 14.1 % (ref 11.5–15.5)
WBC: 4.5 10*3/uL (ref 4.0–10.5)

## 2020-08-14 LAB — PSA: PSA: 0.32 ng/mL (ref 0.10–4.00)

## 2020-08-14 NOTE — Patient Instructions (Signed)
Give us 2-3 business days to get the results of your labs back.   Keep the diet clean and stay active.  Let us know if you need anything. 

## 2020-08-14 NOTE — Progress Notes (Signed)
Chief Complaint  Patient presents with  . Annual Exam    Well Male Curtis Lawson is here for a complete physical.   His last physical was >1 year ago.  Current diet: in general, a "healthy" diet.   Current exercise: Crossfit Weight trend: stable Fatigue out of ordinary? No. Seat belt? Yes.    Health maintenance Tetanus- Yes HIV- Yes Hep C- No  Past Medical History:  Diagnosis Date  . 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 Thrombocytopenia (HCC)      Past Surgical History:  Procedure Laterality Date  . NO PAST SURGERIES      Medications  Takes no meds routienly.    Allergies No Known Allergies  Family History Family History  Problem Relation Age of Onset  . Diabetes Mother     Review of Systems: Constitutional: no fevers or chills Eye:  no recent significant change in vision Ear/Nose/Mouth/Throat:  Ears:  no hearing loss Nose/Mouth/Throat:  no complaints of nasal congestion, no sore throat Cardiovascular:  no chest pain Respiratory:  no shortness of breath Gastrointestinal:  no abdominal pain, no change in bowel habits GU:  Male: negative for dysuria, frequency, and incontinence Musculoskeletal/Extremities:  no pain of the joints Integumentary (Skin/Breast):  no abnormal skin lesions reported Neurologic:  no headaches Endocrine: No unexpected weight changes Hematologic/Lymphatic:  no night sweats  Exam BP 132/82 (BP Location: Left Arm, Patient Position: Sitting, Cuff Size: Normal)   Pulse 64   Temp 98.6 F (37 C) (Oral)   Ht 5\' 9"  (1.753 m)   Wt 184 lb 2 oz (83.5 kg)   SpO2 97%   BMI 27.19 kg/m  General:  well developed, well nourished, in no apparent distress Skin:  no significant moles, warts, or growths Head:  no masses, lesions, or tenderness Eyes:  pupils equal and round, sclera anicteric without injection Ears:  canals without lesions, TMs shiny without retraction, no obvious effusion, no erythema Nose:  nares patent, septum midline, mucosa  normal Throat/Pharynx:  lips and gingiva without lesion; tongue and uvula midline; non-inflamed pharynx; no exudates or postnasal drainage Neck: neck supple without adenopathy, thyromegaly, or masses Lungs:  clear to auscultation, breath sounds equal bilaterally, no respiratory distress Cardio:  regular rate and rhythm, no bruits, no LE edema Abdomen:  abdomen soft, nontender; bowel sounds normal; no masses or organomegaly Rectal: Deferred Musculoskeletal:  symmetrical muscle groups noted without atrophy or deformity Extremities:  no clubbing, cyanosis, or edema, no deformities, no skin discoloration Neuro:  gait normal; deep tendon reflexes normal and symmetric Psych: well oriented with normal range of affect and appropriate judgment/insight  Assessment and Plan  Well adult exam - Plan: Lipid panel, CBC, Comprehensive metabolic panel  Screening for malignant neoplasm of prostate - Plan: PSA  Encounter for hepatitis C screening test for low risk patient - Plan: Hepatitis C antibody  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 male. Counseled on diet and exercise. Counseled on risks and benefits of prostate cancer screening with PSA. The patient agrees to undergo screening.  Other orders as above. Follow up in 1 yr pending the above workup. The patient voiced understanding and agreement to the plan.  [address] [place], DO 08/14/20 8:28 AM

## 2020-08-18 LAB — HEPATITIS C ANTIBODY
Hepatitis C Ab: NONREACTIVE
SIGNAL TO CUT-OFF: 0.16 (ref <1.00)

## 2020-11-05 ENCOUNTER — Telehealth: Payer: Self-pay | Admitting: Family Medicine

## 2020-11-05 DIAGNOSIS — Z3009 Encounter for other general counseling and advice on contraception: Secondary | ICD-10-CM

## 2020-11-05 NOTE — Telephone Encounter (Signed)
Pt. Is calling because he was informed of receiving a scan regarding his lab work from his 08/14/2020 appointment and he also mentioned Dr Carmelia Roller giving him information on a vasectomy. He hasnt heard anything back regarding either and wanted to see about getting that information.

## 2020-11-06 NOTE — Telephone Encounter (Signed)
Called left msg to call back. 

## 2020-11-06 NOTE — Telephone Encounter (Signed)
Patient informed. 

## 2020-11-06 NOTE — Telephone Encounter (Signed)
I will order both. I didn't hear anything from him about the scan and believe there was a miscommunication about the vasectomy thinking he would let me know. That referral has been placed also. Ty.

## 2020-12-02 ENCOUNTER — Other Ambulatory Visit: Payer: Self-pay

## 2020-12-02 ENCOUNTER — Ambulatory Visit (HOSPITAL_BASED_OUTPATIENT_CLINIC_OR_DEPARTMENT_OTHER)
Admission: RE | Admit: 2020-12-02 | Discharge: 2020-12-02 | Disposition: A | Discharge status: Home / Self Care | Payer: 59 | Source: Ambulatory Visit | Attending: Family Medicine | Admitting: Family Medicine

## 2020-12-02 DIAGNOSIS — Z136 Encounter for screening for cardiovascular disorders: Secondary | ICD-10-CM | POA: Insufficient documentation

## 2021-05-28 ENCOUNTER — Encounter: Payer: Self-pay | Admitting: Family Medicine

## 2021-08-17 ENCOUNTER — Ambulatory Visit (INDEPENDENT_AMBULATORY_CARE_PROVIDER_SITE_OTHER): Payer: 59 | Admitting: Family Medicine

## 2021-08-17 ENCOUNTER — Encounter: Payer: Self-pay | Admitting: Family Medicine

## 2021-08-17 VITALS — BP 122/70 | HR 84 | Temp 98.4°F | Ht 68.0 in | Wt 178.2 lb

## 2021-08-17 DIAGNOSIS — Z0000 Encounter for general adult medical examination without abnormal findings: Secondary | ICD-10-CM | POA: Diagnosis not present

## 2021-08-17 DIAGNOSIS — Z1211 Encounter for screening for malignant neoplasm of colon: Secondary | ICD-10-CM | POA: Diagnosis not present

## 2021-08-17 DIAGNOSIS — Z125 Encounter for screening for malignant neoplasm of prostate: Secondary | ICD-10-CM

## 2021-08-17 LAB — COMPREHENSIVE METABOLIC PANEL
ALT: 18 U/L (ref 0–53)
AST: 16 U/L (ref 0–37)
Albumin: 4.6 g/dL (ref 3.5–5.2)
Alkaline Phosphatase: 57 U/L (ref 39–117)
BUN: 14 mg/dL (ref 6–23)
CO2: 30 mEq/L (ref 19–32)
Calcium: 9.8 mg/dL (ref 8.4–10.5)
Chloride: 103 mEq/L (ref 96–112)
Creatinine, Ser: 1.15 mg/dL (ref 0.40–1.50)
GFR: 76.73 mL/min (ref >60.00)
Glucose, Bld: 85 mg/dL (ref 70–99)
Potassium: 3.8 mEq/L (ref 3.5–5.1)
Sodium: 140 mEq/L (ref 135–145)
Total Bilirubin: 1.1 mg/dL (ref 0.2–1.2)
Total Protein: 7.5 g/dL (ref 6.0–8.3)

## 2021-08-17 LAB — LIPID PANEL
Cholesterol: 230 mg/dL — ABNORMAL HIGH (ref 0–200)
HDL: 55.2 mg/dL (ref >39.00)
LDL Cholesterol: 164 mg/dL — ABNORMAL HIGH (ref 0–99)
NonHDL: 175.28
Total CHOL/HDL Ratio: 4
Triglycerides: 54 mg/dL (ref 0.0–149.0)
VLDL: 10.8 mg/dL (ref 0.0–40.0)

## 2021-08-17 LAB — PSA: PSA: 0.31 ng/mL (ref 0.10–4.00)

## 2021-08-17 LAB — CBC
HCT: 44 % (ref 39.0–52.0)
Hemoglobin: 14 g/dL (ref 13.0–17.0)
MCHC: 31.9 g/dL (ref 30.0–36.0)
MCV: 83.2 fl (ref 78.0–100.0)
Platelets: 169 10*3/uL (ref 150.0–400.0)
RBC: 5.29 Mil/uL (ref 4.22–5.81)
RDW: 14.4 % (ref 11.5–15.5)
WBC: 4.5 10*3/uL (ref 4.0–10.5)

## 2021-08-17 MED ORDER — KETOCONAZOLE 2 % EX CREA: 1.0000 "application " | TOPICAL_CREAM | Freq: Every day | CUTANEOUS | 0 refills | Status: AC

## 2021-08-17 NOTE — Patient Instructions (Signed)
Give us 2-3 business days to get the results of your labs back.   Keep the diet clean and stay active.  If you do not hear anything about your referral in the next 1-2 weeks, call our office and ask for an update.  Please get me a copy of your advanced directive form at your convenience.   Let us know if you need anything.  

## 2021-08-17 NOTE — Progress Notes (Signed)
Chief Complaint  Patient presents with   Annual Exam    Jock itch    Well Male Curtis Lawson is here for a complete physical.   His last physical was >1 year ago.  Current diet: in general, a "healthy" diet.   Current exercise: none lately  Weight trend: down a few lbs Fatigue out of ordinary? No. Seat belt? Yes.   Advanced directive? 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   Allergies No Known Allergies  Family History Family History  Problem Relation Age of Onset   Diabetes Mother     Review of Systems: Constitutional: no fevers or chills Eye:  no recent significant change in vision Ear/Nose/Mouth/Throat:  Ears:  no hearing loss Nose/Mouth/Throat:  no complaints of nasal congestion, no sore throat Cardiovascular:  no chest pain Respiratory:  no shortness of breath Gastrointestinal:  no abdominal pain, no change in bowel habits GU:  Male: negative for dysuria, frequency, and incontinence Musculoskeletal/Extremities:  no pain of the joints Integumentary (Skin/Breast):  no abnormal skin lesions reported Neurologic:  no headaches Endocrine: No unexpected weight changes Hematologic/Lymphatic:  no night sweats  Exam BP 122/70   Pulse 84   Temp 98.4 F (36.9 C) (Oral)   Ht 5\' 8"  (1.727 m)   Wt 178 lb 4 oz (80.9 kg)   SpO2 93%   BMI 27.10 kg/m  General:  well developed, well nourished, in no apparent distress Skin:  no significant moles, warts, or growths Head:  no masses, lesions, or tenderness Eyes:  pupils equal and round, sclera anicteric without injection Ears:  canals without lesions, TMs shiny without retraction, no obvious effusion, no erythema Nose:  nares patent, septum midline, mucosa normal Throat/Pharynx:   lips and gingiva without lesion; tongue and uvula midline; non-inflamed pharynx; no exudates or postnasal drainage Neck: neck supple without adenopathy, thyromegaly, or masses Lungs:  clear to auscultation, breath sounds equal bilaterally, no respiratory distress Cardio:  regular rate and rhythm, no bruits, no LE edema Abdomen:  abdomen soft, nontender; bowel sounds normal; no masses or organomegaly Rectal: Deferred Musculoskeletal:  symmetrical muscle groups noted without atrophy or deformity Extremities:  no clubbing, cyanosis, or edema, no deformities, no skin discoloration Neuro:  gait normal; deep tendon reflexes normal and symmetric Psych: well oriented with normal range of affect and appropriate judgment/insight  Assessment and Plan  Well adult exam - Plan: CBC, Comprehensive metabolic panel, Lipid panel  Screening for prostate cancer - Plan: PSA  Screening for colon cancer - Plan: Ambulatory referral to Gastroenterology  Well 46 y.o. male. Counseled on diet and exercise. Counseled on risks and benefits of prostate cancer screening with PSA. The patient agrees to undergo screening. CCS: GI referral placed today.  Advanced directive form requested today.   Other orders as above. Follow up in 1 yr pending the above workup. The patient voiced understanding and agreement to the plan.  [address] [place], DO 08/17/21 8:42 AM

## 2021-12-22 ENCOUNTER — Encounter: Payer: Self-pay | Admitting: Family Medicine

## 2021-12-22 LAB — HM COLONOSCOPY

## 2022-06-01 IMAGING — CT CT CARDIAC CORONARY ARTERY CALCIUM SCORE
2 series · 15 of 20 positions shown, 17 images · non-contrast
Comparison: None.
COMPARISON: None.

Addendum:
EXAM:
OVER-READ INTERPRETATION  CT CHEST

The following report is an over-read performed by radiologist Dr.
Cintia Jaquelin Iraeta [REDACTED] on 12/02/2020. This
over-read does not include interpretation of cardiac or coronary
anatomy or pathology. The coronary calcium score interpretation by
the cardiologist is attached.
CLINICAL DATA: Cardiovascular Disease Risk stratification
Coronary Calcium Score
TECHNIQUE: A gated, non-contrast computed tomography scan of the heart was
performed using 3mm slice thickness. Axial images were analyzed on a
dedicated workstation. Calcium scoring of the coronary arteries was
performed using the Agatston method.

[Series 2: casc 3.0 i36f 2 bestdiast 71 % · axial · 0.39mm/px · z∈[-258,-150]mm · 8 of 48 slices shown, 10 images]
[im 6/48  vessel]
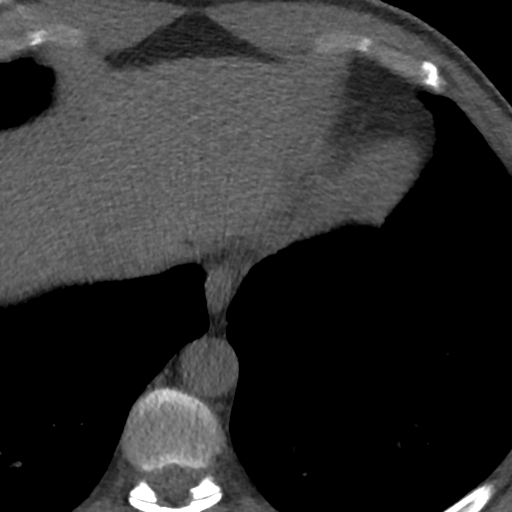
[im 6/48  lung]
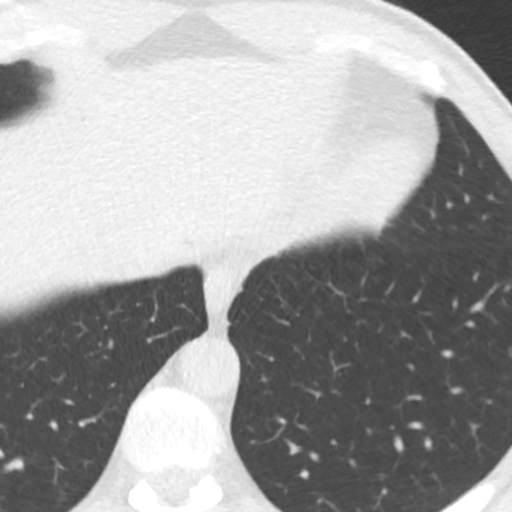
[im 11/48  vessel]
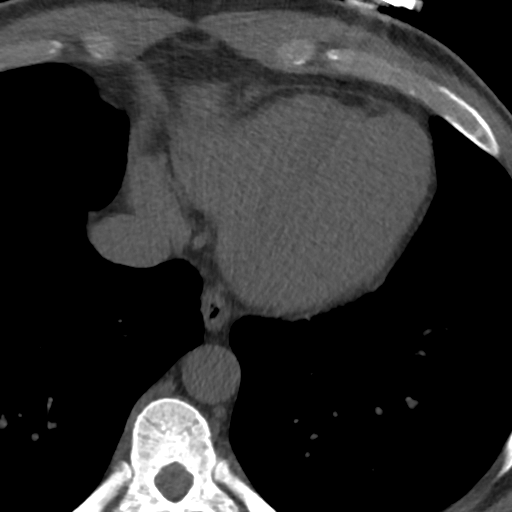
[im 16/48  vessel]
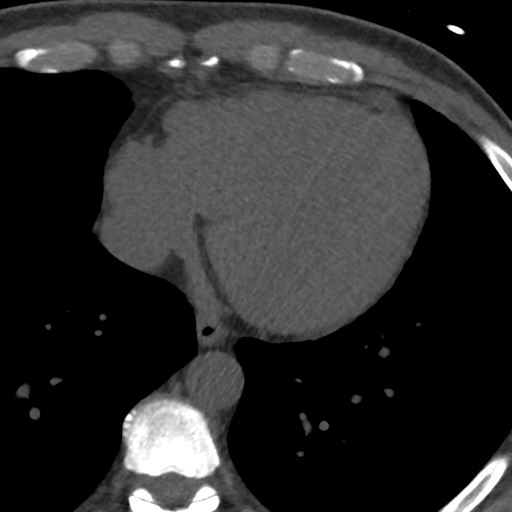
[im 21/48  vessel]
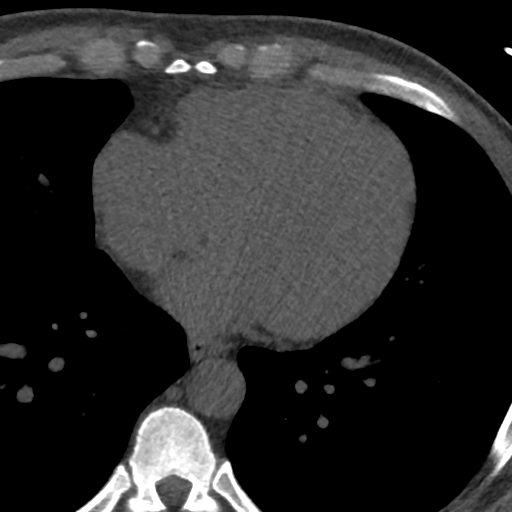
[im 27/48  vessel]
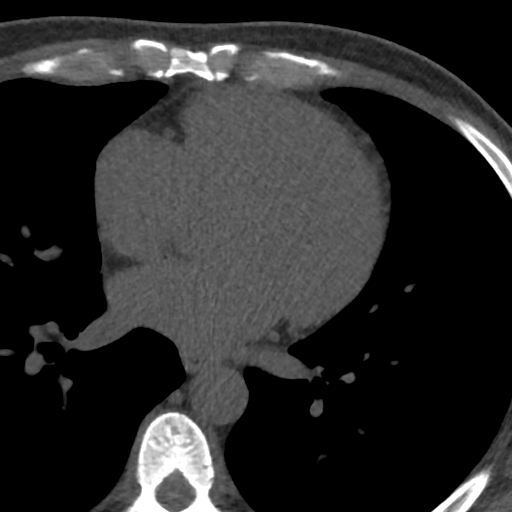
[im 27/48  lung]
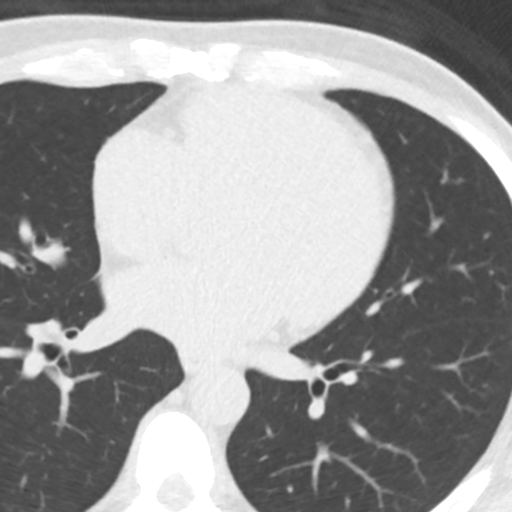
[im 32/48  vessel]
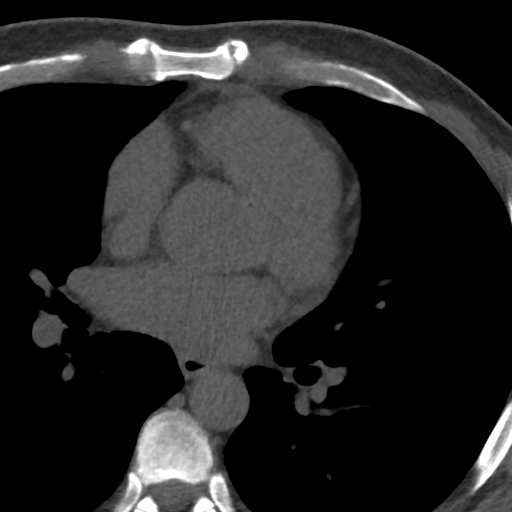
[im 37/48  vessel]
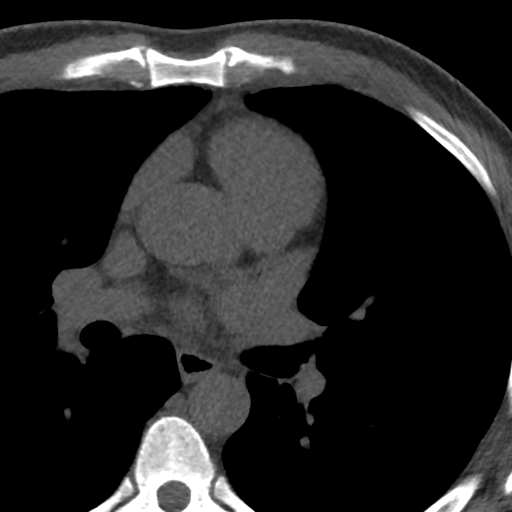
[im 42/48  vessel]
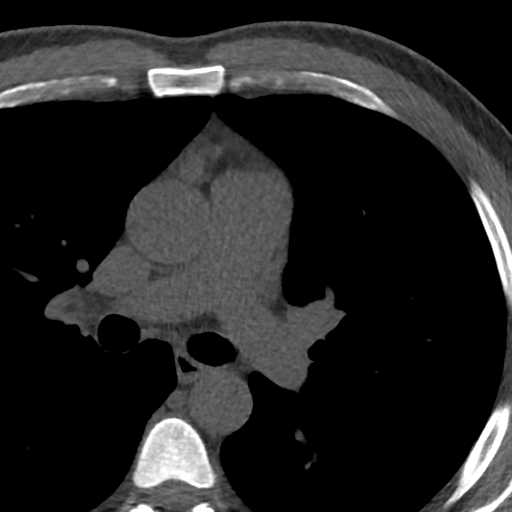

[Series 4: lung st 71 % · axial · 0.77mm/px · z∈[-258,-165]mm · 7 of 48 slices shown]
[im 6/48  lung]
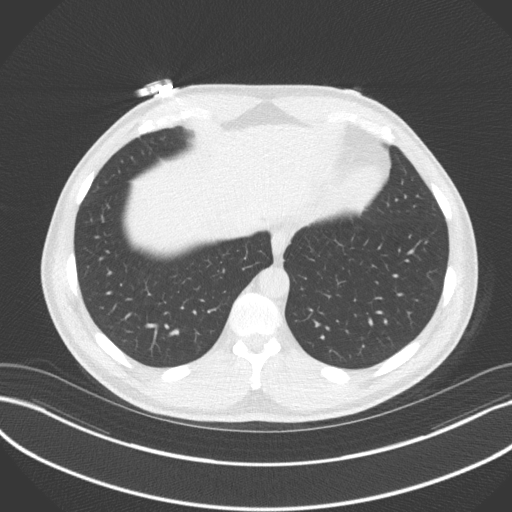
[im 11/48  lung]
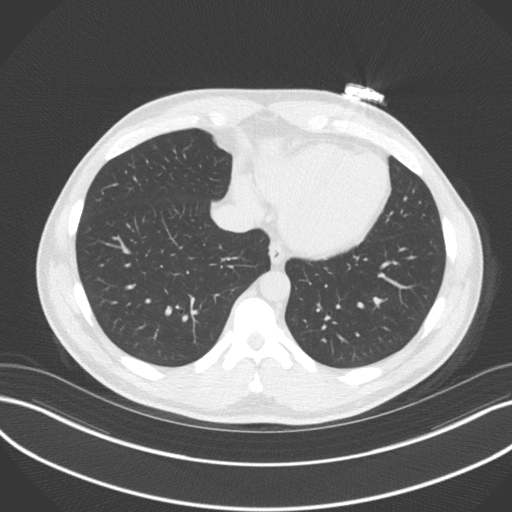
[im 16/48  lung]
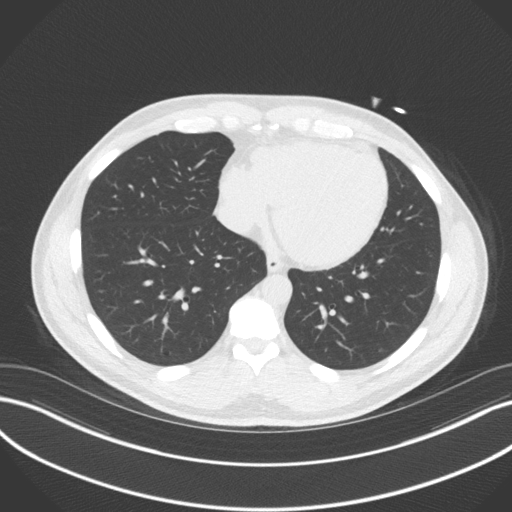
[im 21/48  lung]
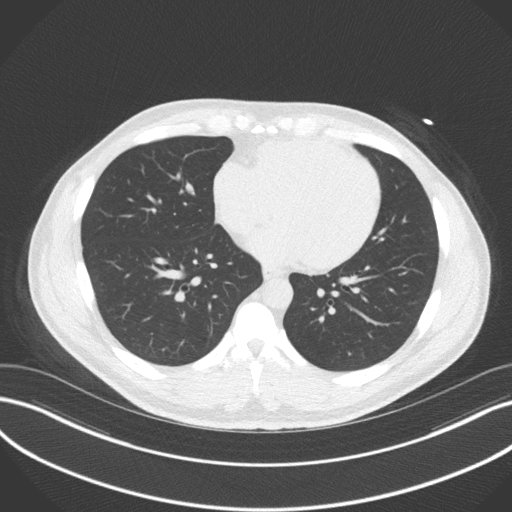
[im 27/48  lung]
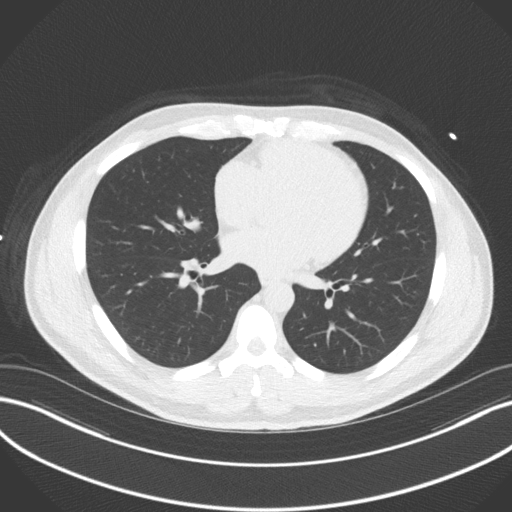
[im 32/48  lung]
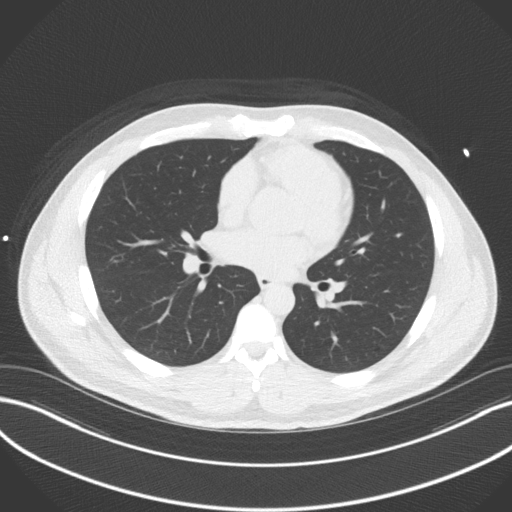
[im 37/48  lung]
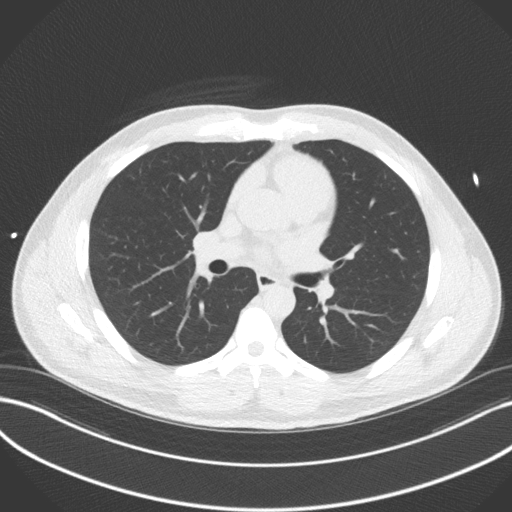

[15 of 20 positions shown; findings below may reference images not displayed]

FINDINGS: Vascular: No significant noncardiac vascular findings.

Mediastinum/Nodes: Visualized mediastinum and hilar regions
demonstrate no lymphadenopathy or masses.

Lungs/Pleura: Visualized lungs show no evidence of pulmonary edema,
consolidation, pneumothorax, nodule or pleural fluid.

Upper Abdomen: No acute abnormality.

Musculoskeletal: No chest wall mass or suspicious bone lesions
identified.
IMPRESSION: No significant incidental findings.
FINDINGS: Coronary arteries: Normal origins.

Coronary Calcium Score:

Left main: 0

Left anterior descending artery: 0

Left circumflex artery: 0

Right coronary artery: 0

Total: 0

Pericardium: Normal.

Ascending Aorta: Normal caliber.

Non-cardiac: See separate report from [REDACTED].
IMPRESSION: Coronary calcium score of 0 Agatston units. This suggests low risk
for future cardiac events.



If CAC=0, it is reasonable to withhold statin therapy and reassess
in 5 to 10 years, as long as higher risk conditions are absent
(diabetes mellitus, family history of premature CHD in first degree
relatives (males <55 years; females <65 years), cigarette smoking,
or LDL >=190 mg/dL).

If CAC is 1 to 99, it is reasonable to initiate statin therapy for
patients >=55 years of age.

If CAC is >=100 or >=75th percentile, it is reasonable to initiate
statin therapy at any age.

Cardiology referral should be considered for patients with CAC
scores >=400 or >=75th percentile.

*9806 AHA/ACC/AACVPR/AAPA/ABC/YIOKKAS/HABES/LUDI/Sabir Hussain/RAMIAR/GIANNI/ORLANDINI
Guideline on the Management of Blood Cholesterol: A Report of the
American College of Cardiology/American Heart Association Task Force
on Clinical Practice Guidelines. J Am Coll Cardiol.
7431;73(24):4802-4653.

*** End of Addendum ***
EXAM:
OVER-READ INTERPRETATION  CT CHEST

The following report is an over-read performed by radiologist Dr.
Cintia Jaquelin Iraeta [REDACTED] on 12/02/2020. This
over-read does not include interpretation of cardiac or coronary
anatomy or pathology. The coronary calcium score interpretation by
the cardiologist is attached.
FINDINGS: Vascular: No significant noncardiac vascular findings.

Mediastinum/Nodes: Visualized mediastinum and hilar regions
demonstrate no lymphadenopathy or masses.

Lungs/Pleura: Visualized lungs show no evidence of pulmonary edema,
consolidation, pneumothorax, nodule or pleural fluid.

Upper Abdomen: No acute abnormality.

Musculoskeletal: No chest wall mass or suspicious bone lesions
identified.
IMPRESSION: No significant incidental findings.

## 2022-08-19 ENCOUNTER — Encounter: Payer: Self-pay | Admitting: Family Medicine

## 2022-08-19 ENCOUNTER — Ambulatory Visit (INDEPENDENT_AMBULATORY_CARE_PROVIDER_SITE_OTHER): Payer: 59 | Admitting: Family Medicine

## 2022-08-19 VITALS — BP 120/82 | HR 63 | Temp 98.0°F | Ht 69.0 in | Wt 172.5 lb

## 2022-08-19 DIAGNOSIS — Z0000 Encounter for general adult medical examination without abnormal findings: Secondary | ICD-10-CM | POA: Diagnosis not present

## 2022-08-19 DIAGNOSIS — Z125 Encounter for screening for malignant neoplasm of prostate: Secondary | ICD-10-CM

## 2022-08-19 LAB — COMPREHENSIVE METABOLIC PANEL
ALT: 17 U/L (ref 0–53)
AST: 16 U/L (ref 0–37)
Albumin: 4.4 g/dL (ref 3.5–5.2)
Alkaline Phosphatase: 53 U/L (ref 39–117)
BUN: 14 mg/dL (ref 6–23)
CO2: 31 mEq/L (ref 19–32)
Calcium: 9.7 mg/dL (ref 8.4–10.5)
Chloride: 103 mEq/L (ref 96–112)
Creatinine, Ser: 1.21 mg/dL (ref 0.40–1.50)
GFR: 71.68 mL/min (ref >60.00)
Glucose, Bld: 86 mg/dL (ref 70–99)
Potassium: 4.2 mEq/L (ref 3.5–5.1)
Sodium: 139 mEq/L (ref 135–145)
Total Bilirubin: 0.5 mg/dL (ref 0.2–1.2)
Total Protein: 7.5 g/dL (ref 6.0–8.3)

## 2022-08-19 LAB — CBC
HCT: 44 % (ref 39.0–52.0)
Hemoglobin: 14.1 g/dL (ref 13.0–17.0)
MCHC: 32 g/dL (ref 30.0–36.0)
MCV: 82.1 fl (ref 78.0–100.0)
Platelets: 162 10*3/uL (ref 150.0–400.0)
RBC: 5.36 Mil/uL (ref 4.22–5.81)
RDW: 14.5 % (ref 11.5–15.5)
WBC: 4.3 10*3/uL (ref 4.0–10.5)

## 2022-08-19 LAB — LIPID PANEL
Cholesterol: 214 mg/dL — ABNORMAL HIGH (ref 0–200)
HDL: 55 mg/dL (ref >39.00)
LDL Cholesterol: 148 mg/dL — ABNORMAL HIGH (ref 0–99)
NonHDL: 158.52
Total CHOL/HDL Ratio: 4
Triglycerides: 55 mg/dL (ref 0.0–149.0)
VLDL: 11 mg/dL (ref 0.0–40.0)

## 2022-08-19 LAB — PSA: PSA: 0.47 ng/mL (ref 0.10–4.00)

## 2022-08-19 NOTE — Patient Instructions (Addendum)
Give [place] 2-3 business days to get the results of your labs back.   Keep the diet clean and stay active.  Please get me a copy of your advanced directive form at your convenience.   Foods that may reduce pain: 1) Ginger 2) Blueberries 3) Salmon 4) Pumpkin seeds 5) dark chocolate 6) turmeric 7) tart cherries 8) virgin olive oil 9) chilli peppers 10) mint 11) krill oil  Try to get at least 7 hrs of sleep nightly.   Let us know if you need anything.

## 2022-08-19 NOTE — Progress Notes (Signed)
Chief Complaint  Patient presents with   Annual Exam    Well Male Curtis Lawson is here for a complete physical.   His last physical was >1 year ago.  Current diet: in general, a "healthy" diet.   Current exercise: some running, Peloton cycling Weight trend: intentionally losing Fatigue out of ordinary? No. Seat belt? Yes.   Advanced directive? 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    Allergies No Known Allergies  Family History Family History  Problem Relation Age of Onset   Diabetes Mother     Review of Systems: Constitutional: no fevers or chills Eye:  no recent significant change in vision Ear/Nose/Mouth/Throat:  Ears:  no hearing loss Nose/Mouth/Throat:  no complaints of nasal congestion, no sore throat Cardiovascular:  no chest pain Respiratory:  no shortness of breath Gastrointestinal:  no abdominal pain, no change in bowel habits GU:  Male: negative for dysuria, frequency, and incontinence Musculoskeletal/Extremities:  no new pain of the joints Integumentary (Skin/Breast):  no abnormal skin lesions reported Neurologic:  no headaches Endocrine: No unexpected weight changes Hematologic/Lymphatic:  no night sweats  Exam BP 120/82 (BP Location: Left Arm, Patient Position: Sitting, Cuff Size: Normal)   Pulse 63   Temp 98 F (36.7 C) (Oral)   Ht 5\' 9"  (1.753 m)   Wt 172 lb 8 oz (78.2 kg)   SpO2 98%   BMI 25.47 kg/m  General:  well developed, well nourished, in no apparent distress Skin:  no significant moles, warts, or growths Head:  no masses, lesions, or tenderness Eyes:  pupils equal and round, sclera anicteric without injection Ears:  canals without lesions, TMs shiny without retraction, no obvious  effusion, no erythema Nose:  nares patent, mucosa normal Throat/Pharynx:  lips and gingiva without lesion; tongue and uvula midline; non-inflamed pharynx; no exudates or postnasal drainage Neck: neck supple without adenopathy, thyromegaly, or masses Lungs:  clear to auscultation, breath sounds equal bilaterally, no respiratory distress Cardio:  regular rate and rhythm, no bruits, no LE edema Abdomen:  abdomen soft, nontender; bowel sounds normal; no masses or organomegaly Rectal: Deferred Musculoskeletal:  symmetrical muscle groups noted without atrophy or deformity Extremities:  no clubbing, cyanosis, or edema, no deformities, no skin discoloration Neuro:  gait normal; deep tendon reflexes normal and symmetric Psych: well oriented with normal range of affect and appropriate judgment/insight  Assessment and Plan  Well adult exam - Plan: CBC, Comprehensive metabolic panel, Lipid panel  Screening for prostate cancer - Plan: PSA   Well 47 y.o. male. Counseled on diet and exercise. Advanced directive form requested today.  Counseled on risks and benefits of prostate cancer screening with PSA. The patient agrees to undergo screening.  Other orders as above. Follow up in 1 yr pending the above workup. The patient voiced understanding and agreement to the plan.  Jilda Roche [place], DO 08/19/22 8:57 AM

## 2022-09-01 ENCOUNTER — Encounter: Payer: Self-pay | Admitting: Family Medicine

## 2022-09-01 ENCOUNTER — Ambulatory Visit: Payer: 59 | Admitting: Family Medicine

## 2022-09-01 ENCOUNTER — Other Ambulatory Visit: Payer: Self-pay

## 2022-09-01 VITALS — BP 122/82 | HR 62 | Ht 69.0 in | Wt 175.0 lb

## 2022-09-01 DIAGNOSIS — G8929 Other chronic pain: Secondary | ICD-10-CM

## 2022-09-01 DIAGNOSIS — M25512 Pain in left shoulder: Secondary | ICD-10-CM | POA: Diagnosis not present

## 2022-09-01 NOTE — Patient Instructions (Signed)
Thank you for coming in today.   Plan for PT.   If not better we can do more including xray, injections or further evaluation like MRI.   Let me know if you don't get better.

## 2022-09-01 NOTE — Progress Notes (Signed)
   I, Curtis Lawson, CMA acting as a scribe for Curtis Graham, MD.  Curtis Lawson is a 47 y.o. male who presents to [facility] at [facility] today for L shoulder pain x 1 year. MOI: unknown. Pt locates pain to posterior aspect of the shoulder. Pt is LHD. Denies sharp shooting pain but notes n/t at night. Notes weakness in the shoulder dating back to college. Denies decreased grip strength, neck pain. ROM is overall well maintained, some restriction when reaching across the body. Hx of HA but denied hx of migraines. Curtis Lawson at [facility] recommended pt come to have the u/s looked at with ultrasound.   Neck pain: no Radiates: no Aggravates: working out, Surveyor, minerals in bed Treatments tried: none  Pertinent review of systems: No fevers or chills  Relevant historical information: Thrombocytopenia   Exam:  BP 122/82   Pulse 62   Ht 5\' 9"  (1.753 m)   Wt 175 lb (79.4 kg)   SpO2 97%   BMI 25.84 kg/m  General: Well Developed, well nourished, and in no acute distress.   MSK: Left shoulder normal-appearing Normal motion some pain with abduction. Intact strength. Positive Hawkins and Neer's test and positive empty can test. Negative Yergason's and speeds test.    Lab and Radiology Results  Diagnostic Limited MSK Ultrasound of: Left shoulder Biceps tendon intact normal. Subscapularis tendon is intact with mild hypoechoic change distal tendon insertion consistent with mild chronic calcific tendinopathy. Supraspinatus tendon is intact with mild acromial bursitis. Infraspinatus tendon is intact. AC joint normal. Impression: Mild subscapularis calcific tendinitis and subacromial bursitis.     Assessment and Plan: 47 y.o. male with chronic left shoulder pain thought to be due to rotator cuff tendinitis and dysfunction and impingement.  He likely has some periscapular dysfunction as well.  Plan for physical therapy and not improving in about 6 weeks.  Better consider injection  or MRI.  Would like to get an x-ray at that time as well if not better.  Refer to [facility].   PDMP not reviewed this encounter. Orders Placed This Encounter  Procedures   [place] LIMITED JOINT SPACE STRUCTURES UP LEFT(NO LINKED CHARGES)    Order Specific Question:   Reason for Exam (SYMPTOM  OR DIAGNOSIS REQUIRED)    Answer:   left shoulder pain    Order Specific Question:   Preferred imaging location?    Answer:   [facility] referral to Physical Therapy    Referral Priority:   Routine    Referral Type:   Physical Medicine    Referral Reason:   Specialty Services Required    Requested Specialty:   Physical Therapy    Number of Visits Requested:   1   No orders of the defined types were placed in this encounter.    Discussed warning signs or symptoms. Please see discharge instructions. Patient expresses understanding.   The above documentation has been reviewed and is accurate and complete Curtis Lawson, M.D.

## 2023-08-25 ENCOUNTER — Ambulatory Visit (INDEPENDENT_AMBULATORY_CARE_PROVIDER_SITE_OTHER): Payer: 59 | Admitting: Family Medicine

## 2023-08-25 ENCOUNTER — Encounter: Payer: Self-pay | Admitting: Family Medicine

## 2023-08-25 VITALS — BP 120/74 | HR 64 | Temp 98.0°F | Resp 16 | Ht 69.0 in | Wt 177.0 lb

## 2023-08-25 DIAGNOSIS — Z125 Encounter for screening for malignant neoplasm of prostate: Secondary | ICD-10-CM | POA: Diagnosis not present

## 2023-08-25 DIAGNOSIS — Z0000 Encounter for general adult medical examination without abnormal findings: Secondary | ICD-10-CM | POA: Diagnosis not present

## 2023-08-25 LAB — CBC
HCT: 45.9 % (ref 39.0–52.0)
Hemoglobin: 14.9 g/dL (ref 13.0–17.0)
MCHC: 32.4 g/dL (ref 30.0–36.0)
MCV: 81.5 fl (ref 78.0–100.0)
Platelets: 146 10*3/uL — ABNORMAL LOW (ref 150.0–400.0)
RBC: 5.63 Mil/uL (ref 4.22–5.81)
RDW: 14 % (ref 11.5–15.5)
WBC: 4.2 10*3/uL (ref 4.0–10.5)

## 2023-08-25 LAB — COMPREHENSIVE METABOLIC PANEL WITH GFR
ALT: 18 U/L (ref 0–53)
AST: 17 U/L (ref 0–37)
Albumin: 4.9 g/dL (ref 3.5–5.2)
Alkaline Phosphatase: 53 U/L (ref 39–117)
BUN: 18 mg/dL (ref 6–23)
CO2: 30 meq/L (ref 19–32)
Calcium: 10.1 mg/dL (ref 8.4–10.5)
Chloride: 101 meq/L (ref 96–112)
Creatinine, Ser: 1.35 mg/dL (ref 0.40–1.50)
GFR: 62.41 mL/min (ref >60.00)
Glucose, Bld: 77 mg/dL (ref 70–99)
Potassium: 4.7 meq/L (ref 3.5–5.1)
Sodium: 139 meq/L (ref 135–145)
Total Bilirubin: 1.1 mg/dL (ref 0.2–1.2)
Total Protein: 7.7 g/dL (ref 6.0–8.3)

## 2023-08-25 LAB — LIPID PANEL
Cholesterol: 256 mg/dL — ABNORMAL HIGH (ref 0–200)
HDL: 48.5 mg/dL (ref >39.00)
LDL Cholesterol: 196 mg/dL — ABNORMAL HIGH (ref 0–99)
NonHDL: 207.42
Total CHOL/HDL Ratio: 5
Triglycerides: 58 mg/dL (ref 0.0–149.0)
VLDL: 11.6 mg/dL (ref 0.0–40.0)

## 2023-08-25 NOTE — Progress Notes (Signed)
 Chief Complaint  Patient presents with   Annual Exam    CPE    Well Male Curtis Lawson is here for a complete physical.   His last physical was >1 year ago.  Current diet: in general, a "healthy" diet.   Current exercise: running, some wt lifting Weight trend: stable Fatigue out of ordinary? No. Seat belt? Yes.   Advanced directive? Yes- need to be updated.  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    Allergies No Known Allergies  Family History Family History  Problem Relation Age of Onset   Diabetes Mother     Review of Systems: Constitutional: no fevers or chills Eye:  no recent significant change in vision Ear/Nose/Mouth/Throat:  Ears:  no hearing loss Nose/Mouth/Throat:  no complaints of nasal congestion, no sore throat Cardiovascular:  no chest pain Respiratory:  no shortness of breath Gastrointestinal:  no abdominal pain, no change in bowel habits GU:  Male: negative for dysuria, frequency, and incontinence Musculoskeletal/Extremities:  no pain of the joints Integumentary (Skin/Breast):  no new abnormal skin lesions reported Neurologic:  no headaches Endocrine: No unexpected weight changes Hematologic/Lymphatic:  no night sweats  Exam BP 120/74 (BP Location: Left Arm, Patient Position: Sitting)   Pulse 64   Temp 98 F (36.7 C) (Oral)   Resp 16   Ht 5\' 9"  (1.753 m)   Wt 177 lb (80.3 kg)   SpO2 97%   BMI 26.14 kg/m  General:  well developed, well nourished, in no apparent distress Skin:  no significant moles, warts, or growths Head:  no masses, lesions, or tenderness Eyes:  pupils equal and round, sclera anicteric without injection Ears:  canals without lesions, TMs shiny without retraction, no obvious  effusion, no erythema Nose:  nares patent, mucosa normal Throat/Pharynx:  lips and gingiva without lesion; tongue and uvula midline; non-inflamed pharynx; no exudates or postnasal drainage Neck: neck supple without adenopathy, thyromegaly, or masses Lungs:  clear to auscultation, breath sounds equal bilaterally, no respiratory distress Cardio:  regular rate and rhythm, no bruits, no LE edema Abdomen:  abdomen soft, nontender; bowel sounds normal; no masses or organomegaly Rectal: Deferred Musculoskeletal:  symmetrical muscle groups noted without atrophy or deformity Extremities:  no clubbing, cyanosis, or edema, no deformities, no skin discoloration Neuro:  gait normal; deep tendon reflexes normal and symmetric Psych: well oriented with normal range of affect and appropriate judgment/insight  Assessment and Plan  Well adult exam - Plan: Lipid panel, CBC, Comprehensive metabolic panel with GFR  Screening for prostate cancer - Plan: PSA   Well 48 y.o. male. Counseled on diet and exercise. Counseled on risks and benefits of prostate cancer screening with PSA. The patient agrees to undergo screening.  Advanced directive form provided today.  Other orders as above. Follow up in 1 yr pending the above workup. The patient voiced understanding and agreement to the plan.  Shellie Dials [place], DO 08/25/23 8:52 AM

## 2023-08-25 NOTE — Patient Instructions (Signed)
 Give [place] 2-3 business days to get the results of your labs back.   Keep the diet clean and stay active.  Please get me a copy of your advanced directive form at your convenience.   Let us know if you need anything.

## 2023-08-31 ENCOUNTER — Ambulatory Visit: Payer: Self-pay | Admitting: Family Medicine

## 2023-08-31 LAB — PSA: PSA: 0.2 ng/mL (ref 0.10–4.00)

## 2023-09-01 ENCOUNTER — Other Ambulatory Visit: Payer: Self-pay

## 2023-09-01 DIAGNOSIS — E7800 Pure hypercholesterolemia, unspecified: Secondary | ICD-10-CM

## 2023-10-06 ENCOUNTER — Other Ambulatory Visit

## 2023-10-06 ENCOUNTER — Other Ambulatory Visit (INDEPENDENT_AMBULATORY_CARE_PROVIDER_SITE_OTHER)

## 2023-10-06 DIAGNOSIS — E7800 Pure hypercholesterolemia, unspecified: Secondary | ICD-10-CM

## 2023-10-06 LAB — LIPID PANEL
Cholesterol: 184 mg/dL (ref 0–200)
HDL: 48 mg/dL
LDL Cholesterol: 126 mg/dL — ABNORMAL HIGH (ref 0–99)
NonHDL: 136.4
Total CHOL/HDL Ratio: 4
Triglycerides: 52 mg/dL (ref 0.0–149.0)
VLDL: 10.4 mg/dL (ref 0.0–40.0)

## 2023-10-08 ENCOUNTER — Ambulatory Visit: Payer: Self-pay | Admitting: Family Medicine

## 2023-10-08 DIAGNOSIS — E7800 Pure hypercholesterolemia, unspecified: Secondary | ICD-10-CM

## 2023-10-09 ENCOUNTER — Other Ambulatory Visit

## 2023-10-26 ENCOUNTER — Emergency Department (HOSPITAL_BASED_OUTPATIENT_CLINIC_OR_DEPARTMENT_OTHER): Admission: EM | Admit: 2023-10-26 | Discharge: 2023-10-26 | Disposition: A | Discharge status: Home / Self Care

## 2023-10-26 ENCOUNTER — Other Ambulatory Visit: Payer: Self-pay

## 2023-10-26 ENCOUNTER — Emergency Department (HOSPITAL_BASED_OUTPATIENT_CLINIC_OR_DEPARTMENT_OTHER)

## 2023-10-26 ENCOUNTER — Encounter (HOSPITAL_BASED_OUTPATIENT_CLINIC_OR_DEPARTMENT_OTHER): Payer: Self-pay

## 2023-10-26 DIAGNOSIS — R0789 Other chest pain: Secondary | ICD-10-CM | POA: Diagnosis present

## 2023-10-26 DIAGNOSIS — R079 Chest pain, unspecified: Secondary | ICD-10-CM

## 2023-10-26 LAB — CBC
HCT: 45 % (ref 39.0–52.0)
Hemoglobin: 14.4 g/dL (ref 13.0–17.0)
MCH: 26.5 pg (ref 26.0–34.0)
MCHC: 32 g/dL (ref 30.0–36.0)
MCV: 82.7 fL (ref 80.0–100.0)
Platelets: 153 K/uL (ref 150–400)
RBC: 5.44 MIL/uL (ref 4.22–5.81)
RDW: 14 % (ref 11.5–15.5)
WBC: 5.7 K/uL (ref 4.0–10.5)
nRBC: 0 % (ref 0.0–0.2)

## 2023-10-26 LAB — TROPONIN T, HIGH SENSITIVITY: Troponin T High Sensitivity: <15 ng/L (ref <19)

## 2023-10-26 LAB — BASIC METABOLIC PANEL WITH GFR
Anion gap: 11 (ref 5–15)
BUN: 13 mg/dL (ref 6–20)
CO2: 26 mmol/L (ref 22–32)
Calcium: 9.7 mg/dL (ref 8.9–10.3)
Chloride: 102 mmol/L (ref 98–111)
Creatinine, Ser: 1.13 mg/dL (ref 0.61–1.24)
GFR, Estimated: >60 mL/min (ref >60)
Glucose, Bld: 97 mg/dL (ref 70–99)
Potassium: 3.9 mmol/L (ref 3.5–5.1)
Sodium: 139 mmol/L (ref 135–145)

## 2023-10-26 LAB — D-DIMER, QUANTITATIVE: D-Dimer, Quant: <0.27 ug{FEU}/mL (ref 0.00–0.50)

## 2023-10-26 NOTE — Discharge Instructions (Signed)
 Please follow-up with your primary doctor.  Return immediately for fevers, chills, chest pain worsens, shortness of breath, passout, feel your heart is racing or any new or worsening symptoms that are concerning to you.

## 2023-10-26 NOTE — ED Triage Notes (Signed)
 Pt presents with complaints of central chest tightness X 2 days. Also endorses SOB & nausea.

## 2023-10-26 NOTE — ED Provider Notes (Signed)
 [place] [facility] AT [facility] Provider Note   CSN: [id number] Arrival date & time: 10/26/23  1545     Patient presents with: Chest Pain   Curtis Lawson is a 48 y.o. male.   This is a 48 year old male presenting the emergency department for chest pain.  Reports symptoms started 2 days ago.  Constant.  Described as dull ache.  Notes some tightness when he takes a deep breath, but states he has not short of breath.  No nausea or vomiting.  No cardiac history.  He does note symptoms started after he played pickle ball Monday, ran 2 miles on Tuesday and started having symptoms yesterday morning. he is low risk for PE with the exception of recent travel to [place] couple weeks ago.   Chest Pain      Prior to Admission medications   Not on File    Allergies: Patient has no known allergies.    Review of Systems  Cardiovascular:  Positive for chest pain.    Updated Vital Signs BP (!) 141/84 (BP Location: Left Arm)   Pulse 81   Temp 98.2 F (36.8 C) (Oral)   Resp 16   SpO2 100%   Physical Exam Vitals and nursing note reviewed.  Constitutional:      General: He is not in acute distress.    Appearance: He is not ill-appearing or toxic-appearing.  Cardiovascular:     Rate and Rhythm: Normal rate and regular rhythm.     Pulses:          Radial pulses are 2+ on the right side and 2+ on the left side.     Heart sounds: Normal heart sounds.  Pulmonary:     Effort: Pulmonary effort is normal.     Breath sounds: Normal breath sounds. No wheezing, rhonchi or rales.  Musculoskeletal:     Right lower leg: No edema.     Left lower leg: No edema.  Skin:    General: Skin is warm and dry.     Capillary Refill: Capillary refill takes less than 2 seconds.  Neurological:     General: No focal deficit present.     Mental Status: He is alert.  Psychiatric:        Mood and Affect: Mood normal.        Behavior: Behavior normal.     (all labs ordered are  listed, but only abnormal results are displayed) Labs Reviewed  BASIC METABOLIC PANEL WITH GFR  CBC  D-DIMER, QUANTITATIVE  TROPONIN T, HIGH SENSITIVITY    EKG: EKG Interpretation Date/Time:  Thursday October 26 2023 15:52:58 EDT Ventricular Rate:  55 PR Interval:  153 QRS Duration:  83 QT Interval:  391 QTC Calculation: 374 R Axis:   46  Text Interpretation: Sinus rhythm Atrial premature complex Consider left ventricular hypertrophy Abnormal T, consider ischemia, diffuse leads Borderline ST elevation, lateral leads NO SIGNIFICANT CHANGE SINCE LAST TRACING YESTERDAY Confirmed by Neysa Clap [phone]) on 10/26/2023 4:00:37 PM  Radiology: DG Chest 2 View Result Date: 10/26/2023 CLINICAL DATA:  chest pain EXAM: CHEST - 2 VIEW COMPARISON:  None available. FINDINGS: No focal airspace consolidation, pleural effusion, or pneumothorax. No cardiomegaly. No acute fracture or destructive lesion. IMPRESSION: No acute cardiopulmonary abnormality. Electronically Signed   By: Rogelia Myers M.D.   On: 10/26/2023 16:29     Procedures   Medications Ordered in the ED - No data to display  Clinical Course as of 10/26/23 1720  Thu  Oct 26, 2023  1600 Coronary Calcium score of 0 in 2022 per my chart review.  [TY]  1636 D-Dimer, Quant: <0.27 PE less likely given negative D-dimer.  Low risk Wells. [TY]  1636 Troponin T High Sensitivity: <15 ACS unlikely given 2 days of constant symptoms [TY]  1636 Basic metabolic panel No significant metabolic derangements.  Normal kidney function [TY]  1636 CBC No leukocytosis to suggest systemic infection.  No anemia. [TY]  1636 DG Chest 2 View Independently reviewed images, no pneumothorax or wide mediastinum.  Radiology also reading as no acute process [TY]  1720 Patient workup reassuring.  Will discharge with outpatient follow-up.  Suspect MSK etiology for symptoms. [TY]    Clinical Course User Index [TY] Neysa Caron PARAS, DO                                  Medical Decision Making This is a 48 year old male present emergency department for chest pain.  He is afebrile nontachycardic, slightly hypertensive.  EKG without ST segment changes to indicate ischemia and appears similar to prior.  Troponin negative.  Symptoms been constant x 2 days.  ACS unlikely.  No need for delta troponin.  Recent travel to [place], but otherwise low risk for PE.  Will get D-dimer.   See ED course for further MDM and final disposition  Amount and/or Complexity of Data Reviewed External Data Reviewed:     Details: See ED course Labs: ordered. Decision-making details documented in ED Course. Radiology: ordered. Decision-making details documented in ED Course. ECG/medicine tests: independent interpretation performed.    Details: See ED course  Risk Decision regarding hospitalization. Diagnosis or treatment significantly limited by social determinants of health.       Final diagnoses:  None    ED Discharge Orders     None          Neysa Caron PARAS, DO 10/26/23 1720

## 2023-11-03 ENCOUNTER — Telehealth (INDEPENDENT_AMBULATORY_CARE_PROVIDER_SITE_OTHER): Admitting: Family Medicine

## 2023-11-03 ENCOUNTER — Encounter: Payer: Self-pay | Admitting: Family Medicine

## 2023-11-03 DIAGNOSIS — R0789 Other chest pain: Secondary | ICD-10-CM | POA: Diagnosis not present

## 2023-11-03 MED ORDER — PREDNISONE 20 MG PO TABS: 40.0000 mg | ORAL_TABLET | Freq: Every day | ORAL | 0 refills | Status: AC

## 2023-11-03 NOTE — Progress Notes (Signed)
 CC: Chest pain  Curtis Lawson is a 48 y.o. male here for evaluation of chest pain. We are interacting via web portal for an electronic face-to-face visit. I verified patient's ID using 2 identifiers. Patient agreed to proceed with visit via this method. Patient is a passenger in a car, I am at office. Patient and I are present for visit.    Duration of issue: 1 week Quality: sharp Palliation: ibuprofen Provocation: deep breath Severity: 3-4/10 Radiation: none Duration of chest pain: 1 second Associated symptoms: hurts when he takes a deep breathe No inhalations, trauma, fevers.  Neg workup at ER.  Cardiac history: none Family heart history: none Smoker? 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 Seems like costochondritis. Pneumonitis less likely. Possibly pleuritis. D dimer neg in ED. Stretches/exercises for pecs provided.  F/u prn. The patient voiced understanding and agreement to the plan.  Mabel [place], DO 11/03/23 2:39 PM

## 2024-02-05 ENCOUNTER — Ambulatory Visit: Admitting: Family Medicine

## 2024-02-05 ENCOUNTER — Encounter: Payer: Self-pay | Admitting: Family Medicine

## 2024-02-05 VITALS — BP 126/72 | HR 93 | Temp 98.0°F | Resp 16 | Ht 69.0 in | Wt 180.0 lb

## 2024-02-05 DIAGNOSIS — Z23 Encounter for immunization: Secondary | ICD-10-CM

## 2024-02-05 DIAGNOSIS — L729 Follicular cyst of the skin and subcutaneous tissue, unspecified: Secondary | ICD-10-CM | POA: Diagnosis not present

## 2024-02-05 DIAGNOSIS — Z7184 Encounter for health counseling related to travel: Secondary | ICD-10-CM

## 2024-02-05 DIAGNOSIS — E7800 Pure hypercholesterolemia, unspecified: Secondary | ICD-10-CM

## 2024-02-05 LAB — LIPID PANEL
Cholesterol: 211 mg/dL — ABNORMAL HIGH (ref 0–200)
HDL: 56.8 mg/dL (ref >39.00)
LDL Cholesterol: 143 mg/dL — ABNORMAL HIGH (ref 0–99)
NonHDL: 153.77
Total CHOL/HDL Ratio: 4
Triglycerides: 54 mg/dL (ref 0.0–149.0)
VLDL: 10.8 mg/dL (ref 0.0–40.0)

## 2024-02-05 MED ORDER — TYPHOID VACCINE PO CPDR: 1.0000 | DELAYED_RELEASE_CAPSULE | ORAL | 0 refills | Status: AC

## 2024-02-05 MED ORDER — ATOVAQUONE-PROGUANIL HCL 250-100 MG PO TABS: ORAL_TABLET | ORAL | 0 refills | Status: AC

## 2024-02-05 NOTE — Patient Instructions (Addendum)
 Please contact the travel clinic for yellow fever vaccination consideration.   Wear strong bug spray over there.  Give us  2-3 business days to get the results of your labs back.   Let us  know if you need anything.

## 2024-02-05 NOTE — Progress Notes (Signed)
 Chief Complaint  Patient presents with   Medication Refill    Medication Check    Curtis Lawson is a 48 y.o. male here for a skin complaint.  Duration: 1 year Location: scrotum Pruritic? No Painful? No Drainage? No New soaps/lotions/topicals/detergents? No Trauma? No Other associated symptoms: no changes Therapies tried thus far: none  Patient is traveling to [place] in [place] in early December.  He will be there for around 11 days.  He needs things for malaria prophylaxis, typhoid vaccination, hep A/B, and meningitis vaccinations.  He received all his vaccinations as a child.  Past Medical History:  Diagnosis Date   Factor V Leiden carrier    Thrombocytopenia     BP 126/72 (BP Location: Left Arm, Patient Position: Sitting)   Pulse 93   Temp 98 F (36.7 C) (Oral)   Resp 16   Ht 5' 9 (1.753 m)   Wt 180 lb (81.6 kg)   SpO2 100%   BMI 26.58 kg/m  Gen: awake, alert, appearing stated age Lungs: No accessory muscle use Skin: Small nodule measuring 0.2 x 0.4 cm in diameter noted on the posterior right portion of the scrotum. No drainage, erythema, TTP, fluctuance, excoriation Psych: Age appropriate judgment and insight  Travel advice encounter  High cholesterol - Plan: Lipid panel  Cyst of skin  Need for hepatitis B vaccination - Plan: Heplisav-B (HepB-CPG) Vaccine  Need for hepatitis A vaccination - Plan: Hepatitis A vaccine adult IM  Need for meningococcal vaccination - Plan: MENINGOCOCCAL MCV4O  Meningococcal group B vaccine administered - Plan: Meningococcal B, OMV (Bexsero)  Recommended he go to the health clinic to discuss yellow fever vaccination.  Typhoid vaccine sent in.  Malaria prophylaxis with Malarone sent in.  He will start 2 days prior to leaving and continue daily while there and also for 7 days after he returns.  Hepatitis A/B vaccination today.  Meningitis vaccines as well.  Flu shot politely declined. Recheck cholesterol. Reassurance. F/u as  originally scheduled. The patient voiced understanding and agreement to the plan.  Mabel [place], DO 02/05/24 8:22 AM

## 2024-02-06 ENCOUNTER — Ambulatory Visit: Payer: Self-pay | Admitting: Family Medicine

## 2024-02-06 DIAGNOSIS — E7800 Pure hypercholesterolemia, unspecified: Secondary | ICD-10-CM

## 2024-03-25 ENCOUNTER — Encounter: Payer: Self-pay | Admitting: Family Medicine

## 2024-08-30 ENCOUNTER — Encounter: Admitting: Family Medicine
